# Patient Record
Sex: Female | Born: 1985 | Race: Black or African American | Hispanic: No | Marital: Single | Smoking: Current every day smoker
Health system: Southern US, Community
[De-identification: ages and names within clinical notes are randomized; demographics above are authoritative.]

## PROBLEM LIST (undated history)

## (undated) ENCOUNTER — Inpatient Hospital Stay: Payer: Self-pay

## (undated) DIAGNOSIS — F319 Bipolar disorder, unspecified: Secondary | ICD-10-CM

## (undated) DIAGNOSIS — B999 Unspecified infectious disease: Secondary | ICD-10-CM

## (undated) DIAGNOSIS — O99330 Smoking (tobacco) complicating pregnancy, unspecified trimester: Secondary | ICD-10-CM

## (undated) DIAGNOSIS — N611 Abscess of the breast and nipple: Secondary | ICD-10-CM

## (undated) DIAGNOSIS — R51 Headache: Secondary | ICD-10-CM

## (undated) DIAGNOSIS — E669 Obesity, unspecified: Secondary | ICD-10-CM

## (undated) DIAGNOSIS — F209 Schizophrenia, unspecified: Secondary | ICD-10-CM

## (undated) DIAGNOSIS — O2610 Low weight gain in pregnancy, unspecified trimester: Secondary | ICD-10-CM

## (undated) DIAGNOSIS — R519 Headache, unspecified: Secondary | ICD-10-CM

## (undated) DIAGNOSIS — F32A Depression, unspecified: Secondary | ICD-10-CM

## (undated) HISTORY — DX: Headache: R51

## (undated) HISTORY — DX: Obesity, unspecified: E66.9

## (undated) HISTORY — DX: Headache, unspecified: R51.9

## (undated) HISTORY — PX: DILATION AND CURETTAGE OF UTERUS: SHX78

## (undated) HISTORY — DX: Abscess of the breast and nipple: N61.1

## (undated) HISTORY — DX: Smoking (tobacco) complicating pregnancy, unspecified trimester: O99.330

## (undated) HISTORY — DX: Low weight gain in pregnancy, unspecified trimester: O26.10

---
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
## 2004-08-30 ENCOUNTER — Inpatient Hospital Stay (HOSPITAL_COMMUNITY): Admission: AD | Admit: 2004-08-30 | Discharge: 2004-09-01 | Payer: Self-pay | Admitting: *Deleted

## 2005-02-18 ENCOUNTER — Inpatient Hospital Stay (HOSPITAL_COMMUNITY): Admission: EM | Admit: 2005-02-18 | Discharge: 2005-02-19 | Payer: Self-pay | Admitting: Emergency Medicine

## 2006-01-05 ENCOUNTER — Emergency Department (HOSPITAL_COMMUNITY): Admission: EM | Admit: 2006-01-05 | Discharge: 2006-01-05 | Payer: Self-pay | Admitting: Emergency Medicine

## 2006-08-13 ENCOUNTER — Inpatient Hospital Stay (HOSPITAL_COMMUNITY): Admission: AD | Admit: 2006-08-13 | Discharge: 2006-08-13 | Payer: Self-pay | Admitting: Obstetrics

## 2006-08-14 ENCOUNTER — Inpatient Hospital Stay (HOSPITAL_COMMUNITY): Admission: AD | Admit: 2006-08-14 | Discharge: 2006-08-16 | Payer: Self-pay | Admitting: Obstetrics

## 2007-07-06 ENCOUNTER — Emergency Department (HOSPITAL_COMMUNITY): Admission: EM | Admit: 2007-07-06 | Discharge: 2007-07-06 | Payer: Self-pay | Admitting: Emergency Medicine

## 2007-08-01 ENCOUNTER — Inpatient Hospital Stay (HOSPITAL_COMMUNITY): Admission: AD | Admit: 2007-08-01 | Discharge: 2007-08-04 | Payer: Self-pay | Admitting: Gynecology

## 2007-08-01 ENCOUNTER — Ambulatory Visit: Payer: Self-pay | Admitting: Advanced Practice Midwife

## 2007-09-01 ENCOUNTER — Inpatient Hospital Stay (HOSPITAL_COMMUNITY): Admission: AD | Admit: 2007-09-01 | Discharge: 2007-09-01 | Payer: Self-pay | Admitting: Obstetrics & Gynecology

## 2009-04-24 ENCOUNTER — Emergency Department (HOSPITAL_COMMUNITY): Admission: EM | Admit: 2009-04-24 | Discharge: 2009-04-24 | Payer: Self-pay | Admitting: Emergency Medicine

## 2011-01-03 LAB — CBC
MCHC: 33.4 g/dL (ref 30.0–36.0)
Platelets: 288 10*3/uL (ref 150–400)
RDW: 13.4 % (ref 11.5–15.5)

## 2011-01-03 LAB — COMPREHENSIVE METABOLIC PANEL
AST: 25 U/L (ref 0–37)
Albumin: 4.3 g/dL (ref 3.5–5.2)
Chloride: 106 mEq/L (ref 96–112)
Creatinine, Ser: 0.77 mg/dL (ref 0.4–1.2)
GFR calc Af Amer: >60 mL/min (ref >60)
Potassium: 4.2 mEq/L (ref 3.5–5.1)
Total Bilirubin: 0.8 mg/dL (ref 0.3–1.2)
Total Protein: 7.1 g/dL (ref 6.0–8.3)

## 2011-01-03 LAB — URINALYSIS, ROUTINE W REFLEX MICROSCOPIC
Glucose, UA: NEGATIVE mg/dL
Specific Gravity, Urine: 1.022 (ref 1.005–1.030)
pH: 5.5 (ref 5.0–8.0)

## 2011-01-03 LAB — URINE MICROSCOPIC-ADD ON

## 2011-01-03 LAB — DIFFERENTIAL
Basophils Absolute: 0 10*3/uL (ref 0.0–0.1)
Basophils Relative: 0 % (ref 0–1)
Lymphocytes Relative: 12 % (ref 12–46)
Neutro Abs: 9.3 10*3/uL — ABNORMAL HIGH (ref 1.7–7.7)

## 2011-01-03 LAB — POCT PREGNANCY, URINE: Preg Test, Ur: NEGATIVE

## 2011-02-12 NOTE — H&P (Signed)
Kathryn Livingston               ACCOUNT NO.:  [id number]   MEDICAL RECORD NO.:  [id number]          PATIENT TYPE:  EMS   LOCATION:  ED                           FACILITY:  [facility]   PHYSICIAN:  Kela Millin, M.D.DATE OF BIRTH:  03-15-1986   DATE OF ADMISSION:  02/18/2005  DATE OF DISCHARGE:                                HISTORY & PHYSICAL   PRIMARY CARE PHYSICIAN:  Unassigned.   CHIEF COMPLAINT:  Drug overdose.   HISTORY OF PRESENT ILLNESS:  The patient is a 25 year old black female who  was brought in by EMS after taking a handful of Extra Strength Tylenol and  about 4 tablets of Aleve in an attempt to kill herself.  She denies  homicidal ideations.  She states that she took the tablets about 30 minutes  prior to presentation.  The patient was seen in the ER, and her initial  Tylenol level was 165.6.  She was given charcoal as well as Mucomyst, per ER  physician.  The patient denies dysuria, fevers, cough, chest pain,  hematochezia, melena, and no diarrhea.  She admits to nausea, but that only  started after she was given charcoal and later Mucomyst in the ER.  She is  admitted to the [facility] service for further evaluation and  management.   PAST MEDICAL HISTORY:  None.   MEDICATIONS:  None.   ALLERGIES:  NKDA.   SOCIAL HISTORY:  She states that she only drinks occasionally, although she  admits to drinking today prior to presentation.  She also smokes about a  pack of tobacco per day.  Admits to occasional marijuana use.   FAMILY HISTORY:  Her aunt has hypertension and diabetes.  Her grandmother  has cancer, type unknown.   REVIEW OF SYSTEMS:  As per HPI.  Other review of systems negative.   PHYSICAL EXAMINATION:  VITAL SIGNS:  Temperature 98.5, blood pressure  129/74, pulse 96, respiratory rate 20.  Her O2 sat is 99%.  GENERAL:  The patient is a young black female, alert and oriented x3 in no  apparent distress.  HEENT:  PERRL.  EOMI.  Muddy sclerae.   Mucous membranes stained secondary to  charcoal.  NECK:  Supple.  No adenopathy.  LUNGS:  Moderate air movement.  No wheezes.  CARDIOVASCULAR:  Regular rate and rhythm.  Normal S1 and S2.  No murmurs  appreciated.  ABDOMEN:  Soft.  Normal bowel sounds present.  Nontender.  Nondistended.  No  organomegaly or masses palpable.  EXTREMITIES:  No cyanosis or edema.  NEUROLOGIC:  Alert and oriented x 3.  Cranial nerves II-XII grossly intact.  Nonfocal exam.   LABORATORY DATA:  The initial Tylenol level was 165.6.  Her 4-hour Tylenol  level 89.6.  The urinalysis was cloudy in appearance.  Specific gravity of  1.008.  Her urine nitrites were positive.  Leukocyte esterase was moderate  with 3-6 WBCs and many bacteria.  Her white cell count is 8.4 with a  hemoglobin of 12.3, hematocrit 36.6.  Platelet count is 349.  Neutrophil  count is 63%.  Sodium 139, potassium  3.5 with a chloride of 104, CO2 23,  glucose 84, BUN 6, creatinine 0.8, calcium 9.5, total protein 8.  Her  albumin is 4.6.  AST 31.  ALT 21.  Alkaline phosphatase 87.  Total bilirubin  1.6.  Salicylate level is less than 4.  Alcohol level is 22.   ASSESSMENT/PLAN:  1.  Drug overdose/suicide attempt:  As already discussed, the patient took a      handful of Tylenol and some Aleve tablets.  She admits to suicidal      ideations.  Denies homicidal ideations.  She received charcoal in the ER      upon arrival and also was given Mucomyst per ER physician.  The 4-hour      Tylenol level was 89.6.  Per Poison Control, this 4-hour level is      nontoxic, so no further Mucomyst recommended.  The patient will be      admitted with one-on-one observation, suicide precautions.  I will      recheck her LFTs in the a.m., and if she remains stable, a psych      consultation for possible inpatient treatment.  2.  Urinary tract infection:  Will obtain urine cultures and empiric      antibiotics.  3.  Positive alcohol:  I will start the patient on  multivitamin, thiamine,      and Ativan.      ACV/MEDQ  D:  02/18/2005  T:  02/18/2005  Job:  [id number]

## 2011-07-05 LAB — WOUND CULTURE: Gram Stain: NONE SEEN

## 2011-07-06 LAB — CBC
HCT: 29.6 — ABNORMAL LOW
MCHC: 34
MCV: 93.2
Platelets: 296
Platelets: 318
RBC: 3.35 — ABNORMAL LOW
RDW: 14
RDW: 14.1 — ABNORMAL HIGH
WBC: 10.4

## 2011-07-06 LAB — TYPE AND SCREEN
ABO/RH(D): B POS
Antibody Screen: NEGATIVE

## 2011-07-06 LAB — DIFFERENTIAL
Basophils Absolute: 0
Lymphocytes Relative: 18
Lymphs Abs: 1.9
Neutro Abs: 8.2 — ABNORMAL HIGH
Neutrophils Relative %: 78 — ABNORMAL HIGH

## 2011-07-06 LAB — RPR: RPR Ser Ql: NONREACTIVE

## 2011-07-06 LAB — RAPID HIV SCREEN (WH-MAU): Rapid HIV Screen: NONREACTIVE

## 2011-07-06 LAB — HEPATITIS B SURFACE ANTIGEN: Hepatitis B Surface Ag: NEGATIVE

## 2011-07-06 LAB — RUBELLA SCREEN: Rubella: >500 — ABNORMAL HIGH

## 2011-07-08 LAB — I-STAT 8, (EC8 V) (CONVERTED LAB)
BUN: <3 — ABNORMAL LOW
Chloride: 107
Glucose, Bld: 75
Operator id: 198171
Sodium: 137
pCO2, Ven: 38.2 — ABNORMAL LOW
pH, Ven: 7.362 — ABNORMAL HIGH

## 2011-07-08 LAB — POCT I-STAT CREATININE
Creatinine, Ser: 0.5
Operator id: 198171

## 2011-07-08 LAB — CBC
MCHC: 32.9
MCV: 94.1
Platelets: 283
RBC: 3.05 — ABNORMAL LOW
WBC: 8.3

## 2011-07-08 LAB — DIFFERENTIAL
Basophils Relative: 0
Eosinophils Absolute: 0.1
Lymphs Abs: 2.6
Neutro Abs: 5.2
Neutrophils Relative %: 63

## 2011-07-08 LAB — POCT PREGNANCY, URINE: Preg Test, Ur: POSITIVE

## 2011-07-08 LAB — URINALYSIS, ROUTINE W REFLEX MICROSCOPIC
Glucose, UA: NEGATIVE
Hgb urine dipstick: NEGATIVE
Ketones, ur: NEGATIVE
Protein, ur: NEGATIVE
pH: 7

## 2011-07-08 LAB — URINE MICROSCOPIC-ADD ON

## 2011-10-10 ENCOUNTER — Emergency Department (HOSPITAL_COMMUNITY)
Admission: EM | Admit: 2011-10-10 | Discharge: 2011-10-10 | Disposition: A | Discharge status: Home / Self Care | Payer: Medicaid Other | Attending: Emergency Medicine | Admitting: Emergency Medicine

## 2011-10-10 ENCOUNTER — Encounter (HOSPITAL_COMMUNITY): Payer: Self-pay | Admitting: *Deleted

## 2011-10-10 DIAGNOSIS — N390 Urinary tract infection, site not specified: Secondary | ICD-10-CM

## 2011-10-10 DIAGNOSIS — R319 Hematuria, unspecified: Secondary | ICD-10-CM | POA: Insufficient documentation

## 2011-10-10 LAB — URINALYSIS, ROUTINE W REFLEX MICROSCOPIC
Nitrite: NEGATIVE
Specific Gravity, Urine: 1.004 — ABNORMAL LOW (ref 1.005–1.030)
Urobilinogen, UA: 0.2 mg/dL (ref 0.0–1.0)

## 2011-10-10 LAB — POCT PREGNANCY, URINE: Preg Test, Ur: NEGATIVE

## 2011-10-10 LAB — URINE MICROSCOPIC-ADD ON

## 2011-10-10 MED ORDER — CIPROFLOXACIN HCL 500 MG PO TABS: 500.0000 mg | ORAL_TABLET | Freq: Two times a day (BID) | ORAL | Status: AC

## 2011-10-10 MED ORDER — CIPROFLOXACIN HCL 500 MG PO TABS
500.0000 mg | ORAL_TABLET | Freq: Once | ORAL | Status: AC
  Administered 2011-10-10: 500 mg via ORAL
  Filled 2011-10-10: qty 1

## 2011-10-10 NOTE — ED Notes (Signed)
Pt reports blood in urine today.  Denies pain with urination, no flank pain.  Also mentioned a knot behind (R) ear that has been there for several months-no knot noted. Pt also reporting HA.

## 2011-10-10 NOTE — ED Provider Notes (Signed)
History     CSN: [id number]  Arrival date & time 10/10/11  0010   First MD Initiated Contact with Patient 10/10/11 0022      Chief Complaint  Patient presents with  . Hematuria    (Consider location/radiation/quality/duration/timing/severity/associated sxs/prior treatment) HPI Comments: Pt has had light hematuria all day, has no other sx, denies fevers.  Sx are mild, persistent, and not associated with n/v/dysuria.  Has never had before. Tried to place tissue in her vagina to see if it was vaginal but no blood came out.  She has no missed LMP  Patient is a 26 y.o. female presenting with hematuria. The history is provided by the patient.  Hematuria This is a new problem. The current episode started today. The problem is unchanged. She describes the hematuria as gross hematuria. She reports no clotting in her urine stream. Her pain is at a severity of 0/10. She is experiencing no pain. She describes her urine color as light pink. Irritative symptoms do not include frequency, nocturia or urgency. Obstructive symptoms do not include dribbling, incomplete emptying, an intermittent stream, a slower stream or a weak stream. Pertinent negatives include no abdominal pain, chills, dysuria, fever, flank pain, genital pain, nausea or vomiting. She is sexually active.    History reviewed. No pertinent past medical history.  History reviewed. No pertinent past surgical history.  History reviewed. No pertinent family history.  History  Substance Use Topics  . Smoking status: Current Everyday Smoker -- 1.0 packs/day for 10 years    Types: Cigarettes  . Smokeless tobacco: Not on file  . Alcohol Use: Yes     2-3 40s a day for several years    OB History    Grav Para Term Preterm Abortions TAB SAB Ect Mult Living                  Review of Systems  Constitutional: Negative for fever and chills.  Gastrointestinal: Negative for nausea, vomiting and abdominal pain.  Genitourinary: Positive  for hematuria. Negative for dysuria, urgency, frequency, flank pain, incomplete emptying and nocturia.  All other systems reviewed and are negative.    Allergies  Review of patient's allergies indicates no known allergies.  Home Medications   Current Outpatient Rx  Name Route Sig Dispense Refill  . IBUPROFEN 200 MG PO TABS Oral Take 400 mg by mouth once as needed. For pain.    Marland Kitchen CIPROFLOXACIN HCL 500 MG PO TABS Oral Take 1 tablet (500 mg total) by mouth every 12 (twelve) hours. 10 tablet 0    BP 123/68  Pulse 98  Temp(Src) 98 F (36.7 C) (Oral)  Resp 18  SpO2 97%  LMP 09/09/2011  Physical Exam  Nursing note and vitals reviewed. Constitutional: She appears well-developed and well-nourished. No distress.  HENT:  Head: Normocephalic and atraumatic.  Mouth/Throat: Oropharynx is clear and moist. No oropharyngeal exudate.  Eyes: Conjunctivae and EOM are normal. Pupils are equal, round, and reactive to light. Right eye exhibits no discharge. Left eye exhibits no discharge. No scleral icterus.  Neck: Normal range of motion. Neck supple. No JVD present. No thyromegaly present.  Cardiovascular: Normal rate, regular rhythm, normal heart sounds and intact distal pulses.  Exam reveals no gallop and no friction rub.   No murmur heard. Pulmonary/Chest: Effort normal and breath sounds normal. No respiratory distress. She has no wheezes. She has no rales.  Abdominal: Soft. Bowel sounds are normal. She exhibits no distension and no mass. There is no  tenderness.       No CVA ttp  Musculoskeletal: Normal range of motion. She exhibits no edema and no tenderness.  Lymphadenopathy:    She has no cervical adenopathy.  Neurological: She is alert. Coordination normal.  Skin: Skin is warm and dry. No rash noted. No erythema.  Psychiatric: She has a normal mood and affect. Her behavior is normal.    ED Course  Procedures (including critical care time)  Labs Reviewed  URINALYSIS, ROUTINE W REFLEX  MICROSCOPIC - Abnormal; Notable for the following:    APPearance CLOUDY (*)    Specific Gravity, Urine 1.004 (*)    Hgb urine dipstick LARGE (*)    Leukocytes, UA MODERATE (*)    All other components within normal limits  URINE MICROSCOPIC-ADD ON - Abnormal; Notable for the following:    Squamous Epithelial / LPF FEW (*)    Bacteria, UA MANY (*)    All other components within normal limits  POCT PREGNANCY, URINE  POCT PREGNANCY, URINE   No results found.   1. UTI (lower urinary tract infection)       MDM  Benign exam, preg, ua, reeval.  VS normal.   Laboratory evaluation shows nonpregnant, urinary tract infection with mild microscopic hematuria and white blood cells with many bacteria. Ciprofloxacin given in emergency Department by mouth, will discharge with this and  Discharge Prescriptions include:  #1 ciprofloxacin      Vida Roller, MD 10/10/11 [phone]

## 2012-03-14 ENCOUNTER — Encounter (HOSPITAL_COMMUNITY): Payer: Self-pay | Admitting: Emergency Medicine

## 2012-03-14 ENCOUNTER — Emergency Department (HOSPITAL_COMMUNITY)
Admission: EM | Admit: 2012-03-14 | Discharge: 2012-03-14 | Disposition: A | Discharge status: Home / Self Care | Payer: Medicaid Other | Attending: Emergency Medicine | Admitting: Emergency Medicine

## 2012-03-14 DIAGNOSIS — L02415 Cutaneous abscess of right lower limb: Secondary | ICD-10-CM

## 2012-03-14 DIAGNOSIS — L03119 Cellulitis of unspecified part of limb: Secondary | ICD-10-CM | POA: Insufficient documentation

## 2012-03-14 DIAGNOSIS — F17200 Nicotine dependence, unspecified, uncomplicated: Secondary | ICD-10-CM | POA: Insufficient documentation

## 2012-03-14 DIAGNOSIS — L02419 Cutaneous abscess of limb, unspecified: Secondary | ICD-10-CM | POA: Insufficient documentation

## 2012-03-14 MED ORDER — SULFAMETHOXAZOLE-TMP DS 800-160 MG PO TABS
1.0000 | ORAL_TABLET | Freq: Once | ORAL | Status: AC
  Administered 2012-03-14: 1 via ORAL
  Filled 2012-03-14: qty 1

## 2012-03-14 MED ORDER — HYDROCODONE-ACETAMINOPHEN 5-325 MG PO TABS: 1.0000 | ORAL_TABLET | ORAL | Status: AC | PRN

## 2012-03-14 MED ORDER — HYDROCODONE-ACETAMINOPHEN 5-325 MG PO TABS
1.0000 | ORAL_TABLET | Freq: Once | ORAL | Status: AC
  Administered 2012-03-14: 1 via ORAL
  Filled 2012-03-14: qty 1

## 2012-03-14 MED ORDER — SULFAMETHOXAZOLE-TMP DS 800-160 MG PO TABS: 1.0000 | ORAL_TABLET | Freq: Two times a day (BID) | ORAL | Status: AC

## 2012-03-14 NOTE — Discharge Instructions (Signed)

## 2012-03-14 NOTE — ED Provider Notes (Signed)
Medical screening examination/treatment/procedure(s) were performed by non-physician practitioner and as supervising physician I was immediately available for consultation/collaboration.   Kaegan Stigler, MD 03/14/12 2309 

## 2012-03-14 NOTE — ED Notes (Addendum)
Patient with boil in inner right thigh.  Painful to walk and sitting.  Patient also complaining of left thumb numbness and right elbow pain.  No injuries to either.

## 2012-03-14 NOTE — ED Provider Notes (Signed)
History     CSN: [id number]  Arrival date & time 03/14/12  7829   First MD Initiated Contact with Patient 03/14/12 2040      Chief Complaint  Patient presents with  . Recurrent Skin Infections    (Consider location/radiation/quality/duration/timing/severity/associated sxs/prior treatment) HPI Comments: Patient here with right upper thigh abscess - no drainage - denies fever, chills, surrounding tissue redness, nausea or vomiting.  Patient is a 26 y.o. female presenting with abscess. The history is provided by the patient. No language interpreter was used.  Abscess  This is a new problem. The current episode started yesterday. The onset was gradual. The problem occurs rarely. The problem has been unchanged. The abscess is present on the right upper leg. The problem is moderate. The abscess is characterized by redness, painfulness and swelling. The patient was exposed to OTC medications. The abscess first occurred at home. Pertinent negatives include no anorexia, no decrease in physical activity, not sleeping less, not drinking less, no fever, no fussiness, not sleeping more, no diarrhea, no vomiting, no congestion, no rhinorrhea, no sore throat, no decreased responsiveness and no cough. Her past medical history does not include skin abscesses in family. There were no sick contacts. She has received no recent medical care.    History reviewed. No pertinent past medical history.  History reviewed. No pertinent past surgical history.  History reviewed. No pertinent family history.  History  Substance Use Topics  . Smoking status: Current Everyday Smoker -- 1.0 packs/day for 10 years    Types: Cigarettes  . Smokeless tobacco: Not on file  . Alcohol Use: Yes     2-3 40s a day for several years    OB History    Grav Para Term Preterm Abortions TAB SAB Ect Mult Living                  Review of Systems  Constitutional: Negative for fever and decreased responsiveness.  HENT:  Negative for congestion, sore throat and rhinorrhea.   Respiratory: Negative for cough.   Gastrointestinal: Negative for vomiting, diarrhea and anorexia.  Genitourinary: Negative for dysuria and genital sores.  Musculoskeletal: Negative for back pain.  Skin: Positive for wound.  Neurological: Negative for headaches.  All other systems reviewed and are negative.    Allergies  Review of patient's allergies indicates no known allergies.  Home Medications  No current outpatient prescriptions on file.  BP 111/73  Pulse 75  Temp 97.5 F (36.4 C) (Oral)  Resp 18  SpO2 97%  LMP 03/08/2012  Physical Exam  Nursing note and vitals reviewed. Constitutional: She is oriented to person, place, and time. She appears well-developed and well-nourished. No distress.  HENT:  Head: Normocephalic and atraumatic.  Right Ear: External ear normal.  Left Ear: External ear normal.  Nose: Nose normal.  Mouth/Throat: Oropharynx is clear and moist. No oropharyngeal exudate.  Eyes: Conjunctivae are normal. Pupils are equal, round, and reactive to light. No scleral icterus.  Neck: Normal range of motion. Neck supple.  Cardiovascular: Normal rate, regular rhythm and normal heart sounds.  Exam reveals no gallop and no friction rub.   No murmur heard. Pulmonary/Chest: Effort normal and breath sounds normal. No respiratory distress. She has no wheezes. She has no rales. She exhibits no tenderness.  Abdominal: Soft. Bowel sounds are normal. She exhibits no distension. There is no tenderness.  Musculoskeletal: Normal range of motion. She exhibits no edema and no tenderness.  Lymphadenopathy:    She has no  cervical adenopathy.  Neurological: She is alert and oriented to person, place, and time. No cranial nerve deficit.  Skin: Skin is warm and dry. No rash noted. There is erythema. No pallor.       1 cm area of induration to right medial upper thigh  Psychiatric: She has a normal mood and affect. Her behavior  is normal. Judgment and thought content normal.    ED Course  Procedures (including critical care time)  Labs Reviewed - No data to display No results found. INCISION AND DRAINAGE Performed by: Cherrie Distance C. Consent: Verbal consent obtained. Risks and benefits: risks, benefits and alternatives were discussed Type: abscess  Body area: right thigh  Anesthesia: local infiltration  Local anesthetic: lidocaine 2% with epinephrine  Anesthetic total: 4 ml  Complexity: complex Blunt dissection to break up loculations  Drainage: purulent  Drainage amount: small  Packing material: 1/4 in iodoform gauze  Patient tolerance: Patient tolerated the procedure well with no immediate complications.    Thigh abscess    MDM  Patient with abscess of right upper thigh - small amount of drainage noted.        Izola Price [place], [place] 03/14/12 2252

## 2012-10-28 ENCOUNTER — Emergency Department (HOSPITAL_COMMUNITY)
Admission: EM | Admit: 2012-10-28 | Discharge: 2012-10-28 | Disposition: A | Discharge status: Home / Self Care | Payer: Medicaid Other | Attending: Emergency Medicine | Admitting: Emergency Medicine

## 2012-10-28 ENCOUNTER — Encounter (HOSPITAL_COMMUNITY): Payer: Self-pay

## 2012-10-28 DIAGNOSIS — S0990XA Unspecified injury of head, initial encounter: Secondary | ICD-10-CM | POA: Insufficient documentation

## 2012-10-28 DIAGNOSIS — F17200 Nicotine dependence, unspecified, uncomplicated: Secondary | ICD-10-CM | POA: Insufficient documentation

## 2012-10-28 DIAGNOSIS — Z0000 Encounter for general adult medical examination without abnormal findings: Secondary | ICD-10-CM
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
## 2012-10-28 NOTE — ED Notes (Signed)
Post discharge note- received signed release from [facility] for discharge summary from ED visit- faxed to [place], [place] at [phone].

## 2014-09-27 NOTE — L&D Delivery Note (Signed)
Delivery Summary for [facility]  Labor Events:   Preterm labor:   Rupture date:   Rupture time:   Rupture type: Spontaneous  Fluid Color: Clear  Induction:   Augmentation:   Complications:   Cervical ripening:          Delivery:   Episiotomy:   Lacerations:   Repair suture:   Repair # of packets:   Blood loss (ml): 300   Information for the patient's newborn:  Valley, Ke [[id number]]    Delivery 04/19/2015 4:32 PM by  Vaginal, Spontaneous Delivery Sex:  female Gestational Age: [redacted]w[redacted]d Delivery Clinician:  Hildred Laser Living?: Yes        APGARS  One minute Five minutes Ten minutes  Skin color: 1   1      Heart rate: 2   2      Grimace: 2   2      Muscle tone: 2   2      Breathing: 2   2      Totals: 9  9      Presentation/position: Vertex  Right Occiput Anterior Resuscitation: None  Cord information: 3 vessels   Disposition of cord blood: No    Blood gases sent? No Complications:   Placenta: Delivered: 04/19/2015 4:47 PM  Manual removal   appearance Newborn Measurements: Weight: 6 lb 8.2 oz (2954 g)  Height: 19.09"  Head circumference: 31.5 cm  Chest circumference: 31.5 cm  Other providers:  Tammy F Hall Amy F Daniels  Additional  information: Forceps:   Vacuum:   Breech:   Observed anomalies       Delivery Summary for Randall Hiss  Labor Events:   Preterm labor:   Rupture date:   Rupture time:   Rupture type: Spontaneous  Fluid Color: Clear  Induction:   Augmentation:   Complications:   Cervical ripening:          Delivery:   Episiotomy:   Lacerations:   Repair suture:   Repair # of packets:   Blood loss (ml): 300   Information for the patient's newborn:  Josalynn, Johndrow [[id number]]    Delivery 04/19/2015 4:32 PM by  Vaginal, Spontaneous Delivery Sex:  female Gestational Age: [redacted]w[redacted]d Delivery Clinician:  Hildred Laser Living?: Yes        APGARS  One minute Five minutes Ten minutes  Skin color: 1   1      Heart rate: 2    2      Grimace: 2   2      Muscle tone: 2   2      Breathing: 2   2      Totals: 9  9      Presentation/position: Vertex  Right Occiput Anterior Resuscitation: None  Cord information: 3 vessels   Disposition of cord blood: No    Blood gases sent? No Complications:   Placenta: Delivered: 04/19/2015 4:47 PM  Manual removal   appearance Newborn Measurements: Weight: 6 lb 8.2 oz (2954 g)  Height: 19.09"  Head circumference: 31.5 cm  Chest circumference: 31.5 cm  Other providers:  Tammy F Hall Amy F Daniels  Additional  information: Forceps:   Vacuum:   Breech:   Observed anomalies        Delivery Note At 4:32 PM a viable female was delivered via Vaginal, Spontaneous Delivery (Presentation: Right Occiput Anterior).  APGAR: 9, 9; weight 6 lb 8.2 oz (2954 g).   Placenta status: intact ,  Manual removal due to cord avulsion.  Cord: 3 vessels.  Cord pH: not performed  Anesthesia: Epidural  Episiotomy: None Lacerations:   Suture Repair: none Est. Blood Loss (mL): 300  Mom to postpartum.  Baby to Couplet care / Skin to Skin.  Hildred Laser 04/19/2015, 5:12 PM

## 2014-11-08 ENCOUNTER — Emergency Department: Payer: Self-pay | Admitting: Emergency Medicine

## 2014-11-15 LAB — OB RESULTS CONSOLE ABO/RH: RH TYPE: POSITIVE

## 2014-11-15 LAB — OB RESULTS CONSOLE ANTIBODY SCREEN: Antibody Screen: NEGATIVE

## 2014-11-15 LAB — OB RESULTS CONSOLE GC/CHLAMYDIA
CHLAMYDIA, DNA PROBE: NEGATIVE
Gonorrhea: NEGATIVE

## 2014-11-15 LAB — SICKLE CELL SCREEN: SICKLE CELL SCREEN: NEGATIVE

## 2014-11-15 LAB — OB RESULTS CONSOLE VARICELLA ZOSTER ANTIBODY, IGG: VARICELLA IGG: IMMUNE

## 2014-11-15 LAB — OB RESULTS CONSOLE RPR: RPR: NONREACTIVE

## 2014-11-15 LAB — OB RESULTS CONSOLE RUBELLA ANTIBODY, IGM: Rubella: IMMUNE

## 2014-11-15 LAB — OB RESULTS CONSOLE HEPATITIS B SURFACE ANTIGEN: Hepatitis B Surface Ag: NEGATIVE

## 2014-11-15 LAB — OB RESULTS CONSOLE HIV ANTIBODY (ROUTINE TESTING): HIV: NONREACTIVE

## 2014-11-18 ENCOUNTER — Emergency Department: Payer: Self-pay | Admitting: Emergency Medicine

## 2014-12-19 ENCOUNTER — Encounter: Payer: Self-pay | Admitting: Maternal & Fetal Medicine

## 2015-01-20 ENCOUNTER — Encounter
Admit: 2015-01-20 | Disposition: A | Discharge status: Home / Self Care | Payer: Self-pay | Attending: Obstetrics & Gynecology | Admitting: Obstetrics & Gynecology

## 2015-02-26 ENCOUNTER — Ambulatory Visit (INDEPENDENT_AMBULATORY_CARE_PROVIDER_SITE_OTHER): Payer: Medicaid Other | Admitting: Obstetrics and Gynecology

## 2015-02-26 ENCOUNTER — Encounter: Payer: Self-pay | Admitting: Obstetrics and Gynecology

## 2015-02-26 VITALS — BP 120/72 | HR 82 | Ht 66.0 in | Wt 221.5 lb

## 2015-02-26 DIAGNOSIS — O2612 Low weight gain in pregnancy, second trimester: Secondary | ICD-10-CM

## 2015-02-26 DIAGNOSIS — O99019 Anemia complicating pregnancy, unspecified trimester: Secondary | ICD-10-CM

## 2015-02-26 DIAGNOSIS — O26899 Other specified pregnancy related conditions, unspecified trimester: Secondary | ICD-10-CM | POA: Insufficient documentation

## 2015-02-26 DIAGNOSIS — R51 Headache: Secondary | ICD-10-CM

## 2015-02-26 DIAGNOSIS — Z720 Tobacco use: Secondary | ICD-10-CM

## 2015-02-26 DIAGNOSIS — O26893 Other specified pregnancy related conditions, third trimester: Secondary | ICD-10-CM

## 2015-02-26 DIAGNOSIS — O280 Abnormal hematological finding on antenatal screening of mother: Secondary | ICD-10-CM | POA: Insufficient documentation

## 2015-02-26 DIAGNOSIS — Z3493 Encounter for supervision of normal pregnancy, unspecified, third trimester: Secondary | ICD-10-CM

## 2015-02-26 DIAGNOSIS — F1290 Cannabis use, unspecified, uncomplicated: Secondary | ICD-10-CM | POA: Insufficient documentation

## 2015-02-26 DIAGNOSIS — R519 Headache, unspecified: Secondary | ICD-10-CM | POA: Insufficient documentation

## 2015-02-26 DIAGNOSIS — O2610 Low weight gain in pregnancy, unspecified trimester: Secondary | ICD-10-CM | POA: Insufficient documentation

## 2015-02-26 DIAGNOSIS — O99012 Anemia complicating pregnancy, second trimester: Secondary | ICD-10-CM

## 2015-02-26 DIAGNOSIS — F1210 Cannabis abuse, uncomplicated: Secondary | ICD-10-CM

## 2015-02-26 HISTORY — DX: Anemia complicating pregnancy, unspecified trimester: O99.019

## 2015-02-26 HISTORY — DX: Abnormal hematological finding on antenatal screening of mother: O28.0

## 2015-02-26 LAB — POCT URINALYSIS DIPSTICK
Bilirubin, UA: NEGATIVE
GLUCOSE UA: NEGATIVE
KETONES UA: NEGATIVE
Nitrite, UA: NEGATIVE
PH UA: 7.5
Protein, UA: NEGATIVE
Spec Grav, UA: <=1.005
UROBILINOGEN UA: 0.2

## 2015-02-26 NOTE — Progress Notes (Signed)
ROB: Patient doing well, no complaints. Still admits to smoking cigarettes (however has increased to 5-6/day as she is trying to cut down on marijuana use (currently 1 blunt per day)).  Tried Ensure and Boost to increase weight gain but did not like the taste. Was using marijuana to increase appetite. Counseled again on risks of tobacco and marijuana use in pregnancy. Recommend initiation of nicotine gum. Can try protein bars for increasing weight. RTC in 2 weeks.

## 2015-02-26 NOTE — Patient Instructions (Addendum)
Advised on cessation of smoking (tobacco and marijuana). Can begin use of nicotine gum if needed.  RTC in 2 weeks for routine OB visit.  Fetal kick counts twice daily.  Preterm labor precautions.

## 2015-03-03 ENCOUNTER — Ambulatory Visit
Admission: RE | Admit: 2015-03-03 | Discharge: 2015-03-03 | Disposition: A | Discharge status: Home / Self Care | Payer: Medicaid Other | Source: Ambulatory Visit | Attending: Obstetrics & Gynecology | Admitting: Obstetrics & Gynecology

## 2015-03-03 ENCOUNTER — Other Ambulatory Visit: Payer: Self-pay | Admitting: Obstetrics & Gynecology

## 2015-03-03 VITALS — BP 108/63 | HR 95 | Temp 98.2°F | Wt 224.0 lb

## 2015-03-03 DIAGNOSIS — Z3A33 33 weeks gestation of pregnancy: Secondary | ICD-10-CM | POA: Insufficient documentation

## 2015-03-03 DIAGNOSIS — O403XX0 Polyhydramnios, third trimester, not applicable or unspecified: Secondary | ICD-10-CM

## 2015-03-03 DIAGNOSIS — O99012 Anemia complicating pregnancy, second trimester: Secondary | ICD-10-CM

## 2015-03-03 DIAGNOSIS — O280 Abnormal hematological finding on antenatal screening of mother: Secondary | ICD-10-CM

## 2015-03-03 DIAGNOSIS — O403XX1 Polyhydramnios, third trimester, fetus 1: Secondary | ICD-10-CM

## 2015-03-09 ENCOUNTER — Observation Stay
Admission: EM | Admit: 2015-03-09 | Discharge: 2015-03-10 | Disposition: A | Discharge status: Home / Self Care | Payer: Medicaid Other | Attending: Obstetrics and Gynecology | Admitting: Obstetrics and Gynecology

## 2015-03-09 DIAGNOSIS — R109 Unspecified abdominal pain: Secondary | ICD-10-CM | POA: Insufficient documentation

## 2015-03-09 DIAGNOSIS — O26893 Other specified pregnancy related conditions, third trimester: Principal | ICD-10-CM | POA: Insufficient documentation

## 2015-03-09 DIAGNOSIS — Z3A34 34 weeks gestation of pregnancy: Secondary | ICD-10-CM | POA: Insufficient documentation

## 2015-03-10 ENCOUNTER — Encounter: Payer: Self-pay | Admitting: Advanced Practice Midwife

## 2015-03-10 DIAGNOSIS — Z3A34 34 weeks gestation of pregnancy: Secondary | ICD-10-CM | POA: Diagnosis not present

## 2015-03-10 DIAGNOSIS — R109 Unspecified abdominal pain: Secondary | ICD-10-CM | POA: Diagnosis present

## 2015-03-10 DIAGNOSIS — O26893 Other specified pregnancy related conditions, third trimester: Secondary | ICD-10-CM | POA: Diagnosis not present

## 2015-03-10 LAB — URINALYSIS COMPLETE WITH MICROSCOPIC (ARMC ONLY)
Bilirubin Urine: NEGATIVE
GLUCOSE, UA: NEGATIVE mg/dL
Hgb urine dipstick: NEGATIVE
Ketones, ur: NEGATIVE mg/dL
NITRITE: NEGATIVE
Protein, ur: NEGATIVE mg/dL
SPECIFIC GRAVITY, URINE: 1.003 — AB (ref 1.005–1.030)
pH: 6 (ref 5.0–8.0)

## 2015-03-10 MED ORDER — ACETAMINOPHEN 325 MG PO TABS: 650.0000 mg | ORAL_TABLET | ORAL | Status: DC | PRN

## 2015-03-10 NOTE — OB Triage Note (Signed)
Pt arrived via EMS. Gown changed, placed in LDR 2, fetal monitoring initiated

## 2015-03-10 NOTE — Discharge Summary (Signed)
Pt discharged home in stable condition accompanied by FOB and two daughters.  Discharge instructions reviewed, pt and FOB verbalized understanding.  Prenatal appointment this wednesday

## 2015-03-12 ENCOUNTER — Encounter: Payer: Medicaid Other | Admitting: Obstetrics and Gynecology

## 2015-03-13 ENCOUNTER — Ambulatory Visit: Payer: Self-pay

## 2015-03-13 ENCOUNTER — Ambulatory Visit (INDEPENDENT_AMBULATORY_CARE_PROVIDER_SITE_OTHER): Payer: Medicaid Other | Admitting: Obstetrics and Gynecology

## 2015-03-13 VITALS — BP 112/72 | HR 89 | Wt 226.2 lb

## 2015-03-13 DIAGNOSIS — Z3403 Encounter for supervision of normal first pregnancy, third trimester: Secondary | ICD-10-CM

## 2015-03-13 LAB — POCT URINALYSIS DIPSTICK
Bilirubin, UA: NEGATIVE
GLUCOSE UA: NEGATIVE
Ketones, UA: NEGATIVE
Nitrite, UA: NEGATIVE
Protein, UA: NEGATIVE
SPEC GRAV UA: 1.01
Urobilinogen, UA: 0.2
pH, UA: 6.5

## 2015-03-13 MED ORDER — ACETAMINOPHEN-CODEINE #3 300-30 MG PO TABS: 1.0000 | ORAL_TABLET | Freq: Four times a day (QID) | ORAL | Status: DC | PRN

## 2015-03-13 NOTE — Patient Instructions (Signed)
Preterm labor precautions reviewed.  Call office or follow up at Labor and Delivery for the following: 1) Contractions less than 10 min apart for longer than 1 hour 2) Rupture of membranes 3) Vaginal bleeding requiring a pad 4) Decreased fetal movement  

## 2015-03-13 NOTE — Progress Notes (Signed)
Patient complains of feet and hand swelling.  Also reports that she needs refill on pain medication for headaches. BPs wnl.  No symptoms of pre-eclampsia.  Trace pedal edema noted on exam.  Discussed compression hose, elevation of legs when at rest.  Will refill T#3 for headaches.  RTC in 2 weeks.  PTL precautions given. For 36 week labs at that time.

## 2015-03-17 ENCOUNTER — Other Ambulatory Visit: Payer: Medicaid Other

## 2015-03-17 ENCOUNTER — Ambulatory Visit
Admission: RE | Admit: 2015-03-17 | Discharge: 2015-03-17 | Disposition: A | Discharge status: Home / Self Care | Payer: Medicaid Other | Source: Ambulatory Visit | Attending: Obstetrics and Gynecology | Admitting: Obstetrics and Gynecology

## 2015-03-17 VITALS — BP 126/73 | HR 91 | Temp 98.2°F | Resp 18 | Ht 65.0 in | Wt 234.0 lb

## 2015-03-17 DIAGNOSIS — Z3A33 33 weeks gestation of pregnancy: Secondary | ICD-10-CM

## 2015-03-17 DIAGNOSIS — O280 Abnormal hematological finding on antenatal screening of mother: Secondary | ICD-10-CM

## 2015-03-17 DIAGNOSIS — Z3A35 35 weeks gestation of pregnancy: Secondary | ICD-10-CM | POA: Diagnosis not present

## 2015-03-17 DIAGNOSIS — O403XX0 Polyhydramnios, third trimester, not applicable or unspecified: Secondary | ICD-10-CM

## 2015-03-17 DIAGNOSIS — O403XX1 Polyhydramnios, third trimester, fetus 1: Secondary | ICD-10-CM

## 2015-03-17 DIAGNOSIS — O99013 Anemia complicating pregnancy, third trimester: Secondary | ICD-10-CM

## 2015-03-17 DIAGNOSIS — Z8759 Personal history of other complications of pregnancy, childbirth and the puerperium: Secondary | ICD-10-CM | POA: Insufficient documentation

## 2015-03-20 ENCOUNTER — Encounter: Payer: Self-pay | Admitting: *Deleted

## 2015-03-20 ENCOUNTER — Telehealth: Payer: Self-pay | Admitting: Obstetrics and Gynecology

## 2015-03-20 ENCOUNTER — Observation Stay
Admission: EM | Admit: 2015-03-20 | Discharge: 2015-03-20 | Disposition: A | Discharge status: Home / Self Care | Payer: Medicaid Other | Attending: Obstetrics and Gynecology | Admitting: Obstetrics and Gynecology

## 2015-03-20 DIAGNOSIS — R600 Localized edema: Secondary | ICD-10-CM | POA: Insufficient documentation

## 2015-03-20 DIAGNOSIS — O26899 Other specified pregnancy related conditions, unspecified trimester: Secondary | ICD-10-CM | POA: Diagnosis not present

## 2015-03-20 MED ORDER — FUROSEMIDE 10 MG/ML IJ SOLN
10.0000 mg | Freq: Once | INTRAMUSCULAR | Status: AC
  Administered 2015-03-20: 10 mg via INTRAMUSCULAR
  Filled 2015-03-20: qty 1

## 2015-03-20 NOTE — Telephone Encounter (Signed)
Pt called and she is about [redacted] weeks pregnant and her feet are swollen and both side from waist down are hurting, pain in shoulder doesn't know if its a pinched nerve or not, she was taking the tylenol 3 that dr cherry gave her and its not helping, pt would like a call back to know if there is anything she can do.
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
## 2015-03-20 NOTE — Telephone Encounter (Signed)
Called pt she states that she has tried compression stockings as well as elevating legs and nothing is helping. Per Dr.Cherry pt was advised to go to the ER and be evaluated in L&D. Pt gave verbal understanding.

## 2015-03-20 NOTE — Discharge Instructions (Signed)
AVS discharge instructions and labor/pregnancy precautions given to patient with no questions or concerns.   Scot Shiraishi F  

## 2015-03-24 ENCOUNTER — Ambulatory Visit
Admission: RE | Admit: 2015-03-24 | Discharge: 2015-03-24 | Disposition: A | Discharge status: Home / Self Care | Payer: Medicaid Other | Source: Ambulatory Visit | Attending: Maternal & Fetal Medicine | Admitting: Maternal & Fetal Medicine

## 2015-03-24 DIAGNOSIS — O409XX0 Polyhydramnios, unspecified trimester, not applicable or unspecified: Secondary | ICD-10-CM

## 2015-03-24 DIAGNOSIS — O403XX1 Polyhydramnios, third trimester, fetus 1: Secondary | ICD-10-CM

## 2015-03-24 DIAGNOSIS — O403XX0 Polyhydramnios, third trimester, not applicable or unspecified: Secondary | ICD-10-CM | POA: Insufficient documentation

## 2015-03-24 DIAGNOSIS — Z3A36 36 weeks gestation of pregnancy: Secondary | ICD-10-CM | POA: Diagnosis not present

## 2015-03-24 NOTE — Progress Notes (Signed)
Ultrasound note (printer not functioning) Kathryn Livingston returns for follow up antenatal testing in the setting of an elevated risk for Down Syndrome by multiple marker screen and mild polyhydramnios seen on detailed ultrasound at 27 weeks. She previously had genetic counseling and elected for cell-free fetal DNA testing, which was normal.  She also reports normal diabetes screening.  Ultrasound demonstrates a single live pregnancy at 36 2/7 weeks. Dating is by LMP consistent with earliest available ultrasound perfomred at [facility] on 11/20/14 with measurements of 18 4/7 weeks.  Fetus is cephalic. AFI 17.2  NST 130s  reactive no contractions  Continue weekly testing.

## 2015-03-27 ENCOUNTER — Encounter: Payer: Self-pay | Admitting: Obstetrics and Gynecology

## 2015-03-27 ENCOUNTER — Ambulatory Visit (INDEPENDENT_AMBULATORY_CARE_PROVIDER_SITE_OTHER): Payer: Medicaid Other | Admitting: Obstetrics and Gynecology

## 2015-03-27 VITALS — BP 122/78 | HR 83 | Wt 230.8 lb

## 2015-03-27 DIAGNOSIS — O99013 Anemia complicating pregnancy, third trimester: Secondary | ICD-10-CM

## 2015-03-27 DIAGNOSIS — O403XX4 Polyhydramnios, third trimester, fetus 4: Secondary | ICD-10-CM

## 2015-03-27 DIAGNOSIS — Z720 Tobacco use: Secondary | ICD-10-CM

## 2015-03-27 DIAGNOSIS — Z3483 Encounter for supervision of other normal pregnancy, third trimester: Secondary | ICD-10-CM

## 2015-03-27 DIAGNOSIS — Z36 Encounter for antenatal screening of mother: Secondary | ICD-10-CM

## 2015-03-27 DIAGNOSIS — F1210 Cannabis abuse, uncomplicated: Secondary | ICD-10-CM

## 2015-03-27 DIAGNOSIS — Z3493 Encounter for supervision of normal pregnancy, unspecified, third trimester: Secondary | ICD-10-CM

## 2015-03-27 DIAGNOSIS — Z369 Encounter for antenatal screening, unspecified: Secondary | ICD-10-CM

## 2015-03-27 DIAGNOSIS — F1290 Cannabis use, unspecified, uncomplicated: Secondary | ICD-10-CM

## 2015-03-27 LAB — POCT URINALYSIS DIPSTICK
BILIRUBIN UA: NEGATIVE
Glucose, UA: NEGATIVE
Ketones, UA: NEGATIVE
NITRITE UA: NEGATIVE
Spec Grav, UA: 1.015
Urobilinogen, UA: 0.2
pH, UA: 6.5

## 2015-03-27 NOTE — Progress Notes (Signed)
ROB: Patient doing well.  Still complains of leg swelling.  Is using compression hose.  Patient seen in L&D triage last week for swelling, given a dose of Lasix which she notes helped some.  Continue to encourage leg elevation while resting.  36 week labs done today.  Discussed Pregnancy Care Management Program. Patient completed form.  Diagnosed with polyhydramnios on 35 week scan with [facility].  Is undergoing surveillance with weekly NSTs. Continued to counsel on smoking cessation. RTC in 1 week.

## 2015-03-28 LAB — OB RESULTS CONSOLE GBS: GBS: NEGATIVE

## 2015-03-28 LAB — OB RESULTS CONSOLE GC/CHLAMYDIA
Chlamydia: NEGATIVE
GC PROBE AMP, GENITAL: NEGATIVE

## 2015-03-30 ENCOUNTER — Observation Stay
Admission: EM | Admit: 2015-03-30 | Discharge: 2015-03-30 | Disposition: A | Discharge status: Home / Self Care | Payer: Medicaid Other | Attending: Obstetrics and Gynecology | Admitting: Obstetrics and Gynecology

## 2015-03-30 DIAGNOSIS — O26899 Other specified pregnancy related conditions, unspecified trimester: Principal | ICD-10-CM | POA: Insufficient documentation

## 2015-03-30 MED ORDER — LACTATED RINGERS IV SOLN: INTRAVENOUS | Status: DC

## 2015-03-30 NOTE — Progress Notes (Signed)
Pt given discharge instructions and ambulatory with ED tech off the unit.

## 2015-03-30 NOTE — OB Triage Note (Signed)
Nitrazine negative 

## 2015-03-31 ENCOUNTER — Inpatient Hospital Stay: Admission: RE | Admit: 2015-03-31 | Payer: Medicaid Other | Source: Ambulatory Visit

## 2015-04-01 LAB — CULTURE, BETA STREP (GROUP B ONLY): STREP GP B CULTURE: NEGATIVE

## 2015-04-01 LAB — GC/CHLAMYDIA PROBE AMP
Chlamydia trachomatis, NAA: NEGATIVE
Neisseria gonorrhoeae by PCR: NEGATIVE

## 2015-04-01 LAB — PLEASE NOTE

## 2015-04-04 ENCOUNTER — Ambulatory Visit (INDEPENDENT_AMBULATORY_CARE_PROVIDER_SITE_OTHER): Payer: Medicaid Other | Admitting: Obstetrics and Gynecology

## 2015-04-04 ENCOUNTER — Other Ambulatory Visit: Payer: Self-pay | Admitting: Obstetrics and Gynecology

## 2015-04-04 ENCOUNTER — Other Ambulatory Visit: Payer: Medicaid Other

## 2015-04-04 VITALS — BP 132/82 | HR 84 | Wt 233.0 lb

## 2015-04-04 DIAGNOSIS — Z1389 Encounter for screening for other disorder: Secondary | ICD-10-CM

## 2015-04-04 DIAGNOSIS — R319 Hematuria, unspecified: Secondary | ICD-10-CM

## 2015-04-04 DIAGNOSIS — R809 Proteinuria, unspecified: Secondary | ICD-10-CM

## 2015-04-04 DIAGNOSIS — Z331 Pregnant state, incidental: Secondary | ICD-10-CM

## 2015-04-04 LAB — POCT URINALYSIS DIPSTICK
BILIRUBIN UA: NEGATIVE
GLUCOSE UA: NEGATIVE
Ketones, UA: NEGATIVE
NITRITE UA: NEGATIVE
PH UA: 6.5
SPEC GRAV UA: 1.02
Urobilinogen, UA: 0.2

## 2015-04-04 NOTE — Progress Notes (Signed)
ZIKA EXPOSURE SCREEN:  The patient has not traveled to a Zika Virus endemic area within the past 6 months, nor has she had unprotected sex with a partner who has travelled to a Zika endemic region within the past 6 months. The patient has been advised to notify us if these factors change any time during this current pregnancy, so adequate testing and monitoring can be initiated.  

## 2015-04-04 NOTE — Progress Notes (Signed)
ROB: Patient doing well. Reports mild occasional contractions.  Declines cervical check.  Still notes smoking 1 blunt per day for appetite stimulation. Still smokes an occasional cigarette.   Labor precautions given. BPs slightly elevated above patient's norm, but still wnl.  No change in proteinuria since last visit.  RTC in 1 week.

## 2015-04-09 LAB — URINE CULTURE

## 2015-04-10 ENCOUNTER — Ambulatory Visit
Admission: RE | Admit: 2015-04-10 | Discharge: 2015-04-10 | Disposition: A | Discharge status: Home / Self Care | Payer: Medicaid Other | Source: Ambulatory Visit | Attending: Obstetrics & Gynecology | Admitting: Obstetrics & Gynecology

## 2015-04-10 ENCOUNTER — Ambulatory Visit (INDEPENDENT_AMBULATORY_CARE_PROVIDER_SITE_OTHER): Payer: Medicaid Other | Admitting: Obstetrics and Gynecology

## 2015-04-10 VITALS — BP 125/77 | HR 84 | Wt 226.6 lb

## 2015-04-10 DIAGNOSIS — O403XX0 Polyhydramnios, third trimester, not applicable or unspecified: Secondary | ICD-10-CM | POA: Insufficient documentation

## 2015-04-10 DIAGNOSIS — Z3493 Encounter for supervision of normal pregnancy, unspecified, third trimester: Secondary | ICD-10-CM

## 2015-04-10 DIAGNOSIS — Z3483 Encounter for supervision of other normal pregnancy, third trimester: Secondary | ICD-10-CM

## 2015-04-10 LAB — POCT URINALYSIS DIPSTICK
Bilirubin, UA: NEGATIVE
Glucose, UA: NEGATIVE
Ketones, UA: 80
Nitrite, UA: NEGATIVE
PH UA: 6.5
SPEC GRAV UA: 1.015
UROBILINOGEN UA: NEGATIVE

## 2015-04-10 LAB — US OB FOLLOW UP

## 2015-04-10 NOTE — Progress Notes (Signed)
ROB: Patient complains of infrequent contractions however worse in intensity from last visit.  Cervix essentially unchanged.  Labor precautions.  Has appt with [facility] today to f/u with polyhydramnios.

## 2015-04-14 ENCOUNTER — Telehealth: Payer: Self-pay

## 2015-04-14 DIAGNOSIS — B958 Unspecified staphylococcus as the cause of diseases classified elsewhere: Secondary | ICD-10-CM

## 2015-04-14 MED ORDER — NITROFURANTOIN MONOHYD MACRO 100 MG PO CAPS: 100.0000 mg | ORAL_CAPSULE | Freq: Two times a day (BID) | ORAL | Status: DC

## 2015-04-14 NOTE — Telephone Encounter (Signed)
-----   Message from Hildred LaserAnika Cherry, MD sent at 04/13/2015  4:24 PM EDT ----- Please inform patient of UTI (Staphylococcus saprophyticus).  Needs treatment with Macrobid.

## 2015-04-14 NOTE — Telephone Encounter (Signed)
Called pt informed her of UTI, RX sent in.

## 2015-04-17 ENCOUNTER — Ambulatory Visit (INDEPENDENT_AMBULATORY_CARE_PROVIDER_SITE_OTHER): Payer: Medicaid Other | Admitting: Obstetrics and Gynecology

## 2015-04-17 ENCOUNTER — Ambulatory Visit
Admission: RE | Admit: 2015-04-17 | Discharge: 2015-04-17 | Disposition: A | Discharge status: Home / Self Care | Payer: Medicaid Other | Source: Ambulatory Visit | Attending: Maternal & Fetal Medicine | Admitting: Maternal & Fetal Medicine

## 2015-04-17 VITALS — BP 123/73 | HR 87 | Wt 231.6 lb

## 2015-04-17 DIAGNOSIS — F1290 Cannabis use, unspecified, uncomplicated: Secondary | ICD-10-CM

## 2015-04-17 DIAGNOSIS — Z3A39 39 weeks gestation of pregnancy: Secondary | ICD-10-CM | POA: Insufficient documentation

## 2015-04-17 DIAGNOSIS — Z720 Tobacco use: Secondary | ICD-10-CM

## 2015-04-17 DIAGNOSIS — O99013 Anemia complicating pregnancy, third trimester: Secondary | ICD-10-CM

## 2015-04-17 DIAGNOSIS — O403XX4 Polyhydramnios, third trimester, fetus 4: Secondary | ICD-10-CM

## 2015-04-17 DIAGNOSIS — F1210 Cannabis abuse, uncomplicated: Secondary | ICD-10-CM

## 2015-04-17 DIAGNOSIS — O403XX0 Polyhydramnios, third trimester, not applicable or unspecified: Secondary | ICD-10-CM | POA: Insufficient documentation

## 2015-04-17 NOTE — Progress Notes (Signed)
Pt unable to void. Voided prior to visit with MD.

## 2015-04-18 NOTE — Progress Notes (Signed)
ROB: Patient complains of fatigue.  Denies contractions. Declines cervical exam.  S/p visit to [facility] earlier today.  Recommend delivery at 40 weeks. NST reactive.  Patient scheduled for IOL on 05/23/2015.

## 2015-04-19 ENCOUNTER — Encounter: Payer: Self-pay | Admitting: *Deleted

## 2015-04-19 ENCOUNTER — Inpatient Hospital Stay: Payer: Medicaid Other | Admitting: Anesthesiology

## 2015-04-19 ENCOUNTER — Inpatient Hospital Stay
Admission: EM | Admit: 2015-04-19 | Discharge: 2015-04-21 | DRG: 775 | Disposition: A | Discharge status: Home / Self Care | Payer: Medicaid Other | Attending: Obstetrics and Gynecology | Admitting: Obstetrics and Gynecology

## 2015-04-19 DIAGNOSIS — F17210 Nicotine dependence, cigarettes, uncomplicated: Secondary | ICD-10-CM | POA: Diagnosis present

## 2015-04-19 DIAGNOSIS — F1210 Cannabis abuse, uncomplicated: Secondary | ICD-10-CM | POA: Diagnosis present

## 2015-04-19 DIAGNOSIS — O99334 Smoking (tobacco) complicating childbirth: Secondary | ICD-10-CM | POA: Diagnosis present

## 2015-04-19 DIAGNOSIS — Z823 Family history of stroke: Secondary | ICD-10-CM | POA: Diagnosis not present

## 2015-04-19 DIAGNOSIS — O99324 Drug use complicating childbirth: Secondary | ICD-10-CM | POA: Diagnosis present

## 2015-04-19 DIAGNOSIS — Z3A40 40 weeks gestation of pregnancy: Secondary | ICD-10-CM | POA: Diagnosis present

## 2015-04-19 DIAGNOSIS — O403XX0 Polyhydramnios, third trimester, not applicable or unspecified: Principal | ICD-10-CM | POA: Diagnosis present

## 2015-04-19 DIAGNOSIS — Z809 Family history of malignant neoplasm, unspecified: Secondary | ICD-10-CM

## 2015-04-19 DIAGNOSIS — Z79899 Other long term (current) drug therapy: Secondary | ICD-10-CM | POA: Diagnosis not present

## 2015-04-19 DIAGNOSIS — Z3483 Encounter for supervision of other normal pregnancy, third trimester: Secondary | ICD-10-CM

## 2015-04-19 DIAGNOSIS — O409XX0 Polyhydramnios, unspecified trimester, not applicable or unspecified: Secondary | ICD-10-CM | POA: Diagnosis present

## 2015-04-19 LAB — TYPE AND SCREEN
ABO/RH(D): B POS
Antibody Screen: NEGATIVE

## 2015-04-19 LAB — CBC
HCT: 31.7 % — ABNORMAL LOW (ref 35.0–47.0)
HEMOGLOBIN: 10.8 g/dL — AB (ref 12.0–16.0)
MCH: 30.9 pg (ref 26.0–34.0)
MCHC: 33.9 g/dL (ref 32.0–36.0)
MCV: 91.1 fL (ref 80.0–100.0)
Platelets: 296 10*3/uL (ref 150–440)
RBC: 3.49 MIL/uL — ABNORMAL LOW (ref 3.80–5.20)
RDW: 14 % (ref 11.5–14.5)
WBC: 9.1 10*3/uL (ref 3.6–11.0)

## 2015-04-19 LAB — COMPREHENSIVE METABOLIC PANEL
ALT: 10 U/L — AB (ref 14–54)
ANION GAP: 9 (ref 5–15)
AST: 17 U/L (ref 15–41)
Albumin: 2.9 g/dL — ABNORMAL LOW (ref 3.5–5.0)
Alkaline Phosphatase: 163 U/L — ABNORMAL HIGH (ref 38–126)
BUN: 6 mg/dL (ref 6–20)
CALCIUM: 8.6 mg/dL — AB (ref 8.9–10.3)
CO2: 21 mmol/L — ABNORMAL LOW (ref 22–32)
CREATININE: 0.45 mg/dL (ref 0.44–1.00)
Chloride: 107 mmol/L (ref 101–111)
GFR calc non Af Amer: >60 mL/min (ref >60)
GLUCOSE: 80 mg/dL (ref 65–99)
Potassium: 3.2 mmol/L — ABNORMAL LOW (ref 3.5–5.1)
Sodium: 137 mmol/L (ref 135–145)
TOTAL PROTEIN: 6.4 g/dL — AB (ref 6.5–8.1)
Total Bilirubin: 1.2 mg/dL (ref 0.3–1.2)

## 2015-04-19 LAB — ABO/RH: ABO/RH(D): B POS

## 2015-04-19 LAB — PROTEIN / CREATININE RATIO, URINE
Creatinine, Urine: 24 mg/dL
Total Protein, Urine: <6 mg/dL

## 2015-04-19 MED ORDER — OXYCODONE-ACETAMINOPHEN 5-325 MG PO TABS
1.0000 | ORAL_TABLET | ORAL | Status: DC | PRN
  Administered 2015-04-20 (×4): 1 via ORAL
  Filled 2015-04-19 (×3): qty 1
  Filled 2015-04-19: qty 2
  Filled 2015-04-19 (×3): qty 1

## 2015-04-19 MED ORDER — LACTATED RINGERS IV SOLN: INTRAVENOUS | Status: DC

## 2015-04-19 MED ORDER — SIMETHICONE 80 MG PO CHEW: 80.0000 mg | CHEWABLE_TABLET | ORAL | Status: DC | PRN

## 2015-04-19 MED ORDER — LIDOCAINE HCL (PF) 1 % IJ SOLN
30.0000 mL | INTRAMUSCULAR | Status: DC | PRN
  Filled 2015-04-19: qty 30

## 2015-04-19 MED ORDER — BUTORPHANOL TARTRATE 1 MG/ML IJ SOLN: 1.0000 mg | INTRAMUSCULAR | Status: DC | PRN

## 2015-04-19 MED ORDER — LACTATED RINGERS IV SOLN: 500.0000 mL | INTRAVENOUS | Status: DC | PRN

## 2015-04-19 MED ORDER — PHENYLEPHRINE 40 MCG/ML (10ML) SYRINGE FOR IV PUSH (FOR BLOOD PRESSURE SUPPORT)
80.0000 ug | PREFILLED_SYRINGE | INTRAVENOUS | Status: DC | PRN
  Filled 2015-04-19: qty 2

## 2015-04-19 MED ORDER — OXYTOCIN 40 UNITS IN LACTATED RINGERS INFUSION - SIMPLE MED
62.5000 mL/h | INTRAVENOUS | Status: DC
  Administered 2015-04-19: 62.5 mL/h via INTRAVENOUS
  Filled 2015-04-19 (×2): qty 1000

## 2015-04-19 MED ORDER — DOCUSATE SODIUM 100 MG PO CAPS
100.0000 mg | ORAL_CAPSULE | Freq: Two times a day (BID) | ORAL | Status: DC
  Administered 2015-04-19 – 2015-04-21 (×4): 100 mg via ORAL
  Filled 2015-04-19 (×5): qty 1

## 2015-04-19 MED ORDER — CITRIC ACID-SODIUM CITRATE 334-500 MG/5ML PO SOLN: 30.0000 mL | ORAL | Status: DC | PRN

## 2015-04-19 MED ORDER — ONDANSETRON HCL 4 MG PO TABS: 4.0000 mg | ORAL_TABLET | ORAL | Status: DC | PRN

## 2015-04-19 MED ORDER — DIBUCAINE 1 % RE OINT: 1.0000 "application " | TOPICAL_OINTMENT | RECTAL | Status: DC | PRN

## 2015-04-19 MED ORDER — LACTATED RINGERS IV SOLN
INTRAVENOUS | Status: DC
  Administered 2015-04-19 (×2): via INTRAVENOUS

## 2015-04-19 MED ORDER — ACETAMINOPHEN 325 MG PO TABS: 650.0000 mg | ORAL_TABLET | ORAL | Status: DC | PRN

## 2015-04-19 MED ORDER — OXYTOCIN 10 UNIT/ML IJ SOLN
INTRAMUSCULAR | Status: AC
  Filled 2015-04-19: qty 2

## 2015-04-19 MED ORDER — EPHEDRINE 5 MG/ML INJ
10.0000 mg | INTRAVENOUS | Status: DC | PRN
  Filled 2015-04-19: qty 2

## 2015-04-19 MED ORDER — BUPIVACAINE-EPINEPHRINE (PF) 0.25% -1:200000 IJ SOLN
INTRAMUSCULAR | Status: DC | PRN
  Administered 2015-04-19: 5 mL

## 2015-04-19 MED ORDER — IBUPROFEN 800 MG PO TABS
800.0000 mg | ORAL_TABLET | Freq: Four times a day (QID) | ORAL | Status: DC
  Administered 2015-04-19 – 2015-04-21 (×6): 800 mg via ORAL
  Filled 2015-04-19 (×7): qty 1

## 2015-04-19 MED ORDER — OXYTOCIN 40 UNITS IN LACTATED RINGERS INFUSION - SIMPLE MED: 62.5000 mL/h | INTRAVENOUS | Status: DC | PRN

## 2015-04-19 MED ORDER — LIDOCAINE HCL (PF) 1 % IJ SOLN
INTRAMUSCULAR | Status: AC
  Filled 2015-04-19: qty 30

## 2015-04-19 MED ORDER — FERROUS SULFATE 325 (65 FE) MG PO TABS
325.0000 mg | ORAL_TABLET | Freq: Two times a day (BID) | ORAL | Status: DC
  Administered 2015-04-20 – 2015-04-21 (×3): 325 mg via ORAL
  Filled 2015-04-19 (×3): qty 1

## 2015-04-19 MED ORDER — PRENATAL MULTIVITAMIN CH
1.0000 | ORAL_TABLET | Freq: Every day | ORAL | Status: DC
  Administered 2015-04-20 – 2015-04-21 (×2): 1 via ORAL
  Filled 2015-04-19 (×2): qty 1

## 2015-04-19 MED ORDER — OXYCODONE-ACETAMINOPHEN 5-325 MG PO TABS
2.0000 | ORAL_TABLET | ORAL | Status: DC | PRN
  Administered 2015-04-21 (×2): 2 via ORAL

## 2015-04-19 MED ORDER — FENTANYL 2.5 MCG/ML W/ROPIVACAINE 0.2% IN NS 100 ML EPIDURAL INFUSION (ARMC-ANES)
9.0000 mL/h | EPIDURAL | Status: DC
  Administered 2015-04-19 (×2): 9 mL/h via EPIDURAL
  Filled 2015-04-19: qty 100

## 2015-04-19 MED ORDER — ZOLPIDEM TARTRATE 5 MG PO TABS: 5.0000 mg | ORAL_TABLET | Freq: Every evening | ORAL | Status: DC | PRN

## 2015-04-19 MED ORDER — ONDANSETRON HCL 4 MG/2ML IJ SOLN: 4.0000 mg | INTRAMUSCULAR | Status: DC | PRN

## 2015-04-19 MED ORDER — HYDROCODONE-ACETAMINOPHEN 5-325 MG PO TABS
2.0000 | ORAL_TABLET | ORAL | Status: DC | PRN
  Filled 2015-04-19: qty 1

## 2015-04-19 MED ORDER — AMMONIA AROMATIC IN INHA
RESPIRATORY_TRACT | Status: AC
  Filled 2015-04-19: qty 10

## 2015-04-19 MED ORDER — LANOLIN HYDROUS EX OINT: TOPICAL_OINTMENT | CUTANEOUS | Status: DC | PRN

## 2015-04-19 MED ORDER — SODIUM CHLORIDE 0.9 % IV SOLN
2.0000 g | Freq: Once | INTRAVENOUS | Status: AC
  Administered 2015-04-19: 2 g via INTRAVENOUS
  Filled 2015-04-19: qty 2000

## 2015-04-19 MED ORDER — WITCH HAZEL-GLYCERIN EX PADS: 1.0000 "application " | MEDICATED_PAD | CUTANEOUS | Status: DC | PRN

## 2015-04-19 MED ORDER — BENZOCAINE-MENTHOL 20-0.5 % EX AERO
1.0000 "application " | INHALATION_SPRAY | CUTANEOUS | Status: DC | PRN
  Administered 2015-04-20: 1 via TOPICAL
  Filled 2015-04-19: qty 56

## 2015-04-19 MED ORDER — DIPHENHYDRAMINE HCL 25 MG PO CAPS: 25.0000 mg | ORAL_CAPSULE | Freq: Four times a day (QID) | ORAL | Status: DC | PRN

## 2015-04-19 MED ORDER — OXYTOCIN BOLUS FROM INFUSION: 500.0000 mL | INTRAVENOUS | Status: DC

## 2015-04-19 MED ORDER — ONDANSETRON HCL 4 MG/2ML IJ SOLN: 4.0000 mg | Freq: Four times a day (QID) | INTRAMUSCULAR | Status: DC | PRN

## 2015-04-19 MED ORDER — DIPHENHYDRAMINE HCL 50 MG/ML IJ SOLN: 12.5000 mg | INTRAMUSCULAR | Status: DC | PRN

## 2015-04-19 NOTE — Anesthesia Preprocedure Evaluation (Signed)
Anesthesia Evaluation  Patient identified by MRN, date of birth, ID band Patient awake    Reviewed: Allergy & Precautions, NPO status , Patient's Chart, lab work & pertinent test results, reviewed documented beta blocker date and time   Airway Mallampati: II  TM Distance: >3 FB     Dental  (+) Chipped   Pulmonary Current Smoker,          Cardiovascular     Neuro/Psych  Headaches,    GI/Hepatic   Endo/Other    Renal/GU      Musculoskeletal   Abdominal   Peds  Hematology  (+) anemia ,   Anesthesia Other Findings   Reproductive/Obstetrics                             Anesthesia Physical Anesthesia Plan  ASA: II  Anesthesia Plan: Epidural   Post-op Pain Management:    Induction:   Airway Management Planned:   Additional Equipment:   Intra-op Plan:   Post-operative Plan:   Informed Consent: I have reviewed the patients History and Physical, chart, labs and discussed the procedure including the risks, benefits and alternatives for the proposed anesthesia with the patient or authorized representative who has indicated his/her understanding and acceptance.     Plan Discussed with: CRNA  Anesthesia Plan Comments:         Anesthesia Quick Evaluation

## 2015-04-19 NOTE — H&P (Signed)
Obstetric History and Physical  Kathryn Livingston is a 29 y.o. [phone] with IUP at [redacted]w[redacted]d presenting for onset of labor. Patient states she has been having  irregular, every 7-10 minutes contractions, none vaginal bleeding, intact membranes, with active fetal movement.    Prenatal Course Source of Care: [facility]  with onset of care at 13 weeks Pregnancy complications or risks: Patient Active Problem List   Diagnosis Date Noted  . Polyhydramnios 04/19/2015  . Indication for care in labor or delivery 03/30/2015  . Labor and delivery, indication for care 03/20/2015  . Polyhydramnios in third trimester 03/17/2015  . Continuous tobacco abuse 02/26/2015  . Marijuana use 02/26/2015  . Low weight gain in pregnancy 02/26/2015  . Headache in pregnancy 02/26/2015  . Abnormal quad screen 02/26/2015  . Anemia in pregnancy 02/26/2015   She plans to breastfeed and bottle feed She desires Nexplanon for postpartum contraception.   Prenatal labs and studies: ABO, Rh: --/--/PENDING (07/23 1103) Antibody: PENDING (07/23 1103) Rubella: Immune (02/19 0000) RPR: Nonreactive (02/19 0000)  HBsAg: Negative (02/19 0000)  HIV: Non-reactive (02/19 0000)  AVW:UJWJXBJY (07/01 0000) 1 hr Glucola  normal Genetic screening abnormal with increased risk for Down's Syndrome on quad screen, normal InformaSeq Anatomy US normal  Prenatal Transfer Tool  Maternal Diabetes: No Genetic Screening: Abnormal:  Results: Elevated risk of Trisomy 21 on Quad screen.  Normal InformaSeq Maternal Ultrasounds/Referrals: Abnormal:  Findings:   Other:Polyhydramnios Fetal Ultrasounds or other Referrals:  Referred to Materal Fetal Medicine  Maternal Substance Abuse:  Yes:  Type: Smoker, Marijuana Significant Maternal Medications:  None Significant Maternal Lab Results: Lab values include: Group B Strep negative  Past Medical History  Diagnosis Date  . Headache   . Breast abscess   . Tobacco use in pregnancy   . Low  maternal weight gain   . Obesity     History reviewed. No pertinent past surgical history.  OB History  Gravida Para Term Preterm AB SAB TAB Ectopic Multiple Living  0 0 0 0 0 0 3    # Outcome Date GA Lbr Len/2nd Weight Sex Delivery Anes PTL Lv  4 Current           3 Term 06/2007   5 lb 5 oz (2.41 kg) F Vag-Spont  N Y  2 Term 09/2005   5 lb 4 oz (2.381 kg) F Vag-Spont  N Y  1 Term 12/2003   5 lb 13 oz (2.637 kg) F Vag-Spont  N Y      History   Social History  . Marital Status: Single    Spouse Name: N/A  . Number of Children: N/A  . Years of Education: N/A   Social History Main Topics  . Smoking status: Current Every Day Smoker -- 0.50 packs/day for 15 years    Types: Cigarettes  . Smokeless tobacco: Never Used  . Alcohol Use: No  . Drug Use: 7.00 per week    Special: Marijuana  . Sexual Activity: Yes   Other Topics Concern  . None   Social History Narrative    Family History  Problem Relation Age of Onset  . Stroke Father   . Cancer Paternal Aunt     Prescriptions prior to admission  Medication Sig Dispense Refill Last Dose  . acetaminophen-codeine (TYLENOL #3) 300-30 MG per tablet Take 1-2 tablets by mouth every 6 (six) hours as needed for moderate pain (headaches). 40 tablet 0 Past Week at Unknown time  .  docusate sodium (COLACE) 100 MG capsule Take 100 mg by mouth 2 (two) times daily.   04/18/2015 at Unknown time  . ferrous fumarate (HEMOCYTE - 106 MG FE) 325 (106 FE) MG TABS tablet Take 1 tablet by mouth.   04/18/2015 at Unknown time  . nitrofurantoin, macrocrystal-monohydrate, (MACROBID) 100 MG capsule Take 1 capsule (100 mg total) by mouth 2 (two) times daily. 14 capsule 0 04/18/2015 at Unknown time  . Prenat-FeFum-FePo-FA-Omega 3 (CONCEPT DHA) 53.5-38-1 MG CAPS   1 04/18/2015 at Unknown time  . Butalbital-APAP-Caffeine 50-300-40 MG CAPS   0 Not Taking at Unknown time    No Known Allergies  Review of Systems: Negative except for what is mentioned in  HPI.  Physical Exam: Temp:  [98.2 F (36.8 C)] 98.2 F (36.8 C) (07/23 0935) Pulse Rate:  [77-88] 77 (07/23 1131) Resp:  [16-18] 18 (07/23 1015) BP: (119-154)/(64-109) 136/64 mmHg (07/23 1131) Weight:  [231 lb (104.781 kg)] 231 lb (104.781 kg) (07/23 0935)   CONSTITUTIONAL: Well-developed, well-nourished female in mild distress.  HENT:  Normocephalic, atraumatic, External right and left ear normal. Oropharynx is clear and moist EYES: Conjunctivae and EOM are normal. Pupils are equal, round, and reactive to light. No scleral icterus.  NECK: Normal range of motion, supple, no masses SKIN: Skin is warm and dry. No rash noted. Not diaphoretic. No erythema. No pallor. NEUROLGIC: Alert and oriented to person, place, and time. Normal reflexes, muscle tone coordination. No cranial nerve deficit noted. PSYCHIATRIC: Normal mood and affect. Normal behavior. Normal judgment and thought content. CARDIOVASCULAR: Normal heart rate noted, regular rhythm RESPIRATORY: Effort and breath sounds normal, no problems with respiration noted ABDOMEN: Soft, nontender, nondistended, gravid. MUSCULOSKELETAL: Normal range of motion. No edema and no tenderness. 2+ distal pulses.  Cervical Exam: Dilatation 4cm   Effacement 80%   Station -3  Presentation: cephalic FHT:  Baseline rate 140 bpm   Variability moderate  Accelerations present   Decelerations none Contractions: Every 5-7 mins   Pertinent Labs/Studies:   Results for orders placed or performed during the hospital encounter of 04/19/15 (from the past 24 hour(s))  CBC     Status: Abnormal   Collection Time: 04/19/15 11:03 AM  Result Value Ref Range   WBC 9.1 3.6 - 11.0 K/uL   RBC 3.49 (L) 3.80 - 5.20 MIL/uL   Hemoglobin 10.8 (L) 12.0 - 16.0 g/dL   HCT 16.1 (L) 09.6 - 04.5 %   MCV 91.1 80.0 - 100.0 fL   MCH 30.9 26.0 - 34.0 pg   MCHC 33.9 32.0 - 36.0 g/dL   RDW 40.9 81.1 - 91.4 %   Platelets 296 150 - 440 K/uL  Type and screen     Status: None  (Preliminary result)   Collection Time: 04/19/15 11:03 AM  Result Value Ref Range   ABO/RH(D) PENDING    Antibody Screen PENDING    Sample Expiration 04/22/2015     Assessment : Kathryn Livingston is a 29 y.o. G4P3003 at [redacted]w[redacted]d being admitted for labor. Polyhydramnios (last scan 2 days ago with AFI of 20.1 cm) Elevated BPs  Plan: Labor: Expectant management.  Augmentation as needed, per protocol FWB: Reassuring fetal heart tracing.  GBS negative Delivery plan: Hopeful for vaginal delivery No h/o HTN in pregnancy.  Unsure if due to pain or PIH.  Will order PIH labs.  Patient desires epidural.    Hildred Laser, MD [facility]

## 2015-04-19 NOTE — Anesthesia Procedure Notes (Signed)
Epidural Patient location during procedure: OB  Staffing Anesthesiologist: Berdine Addison Performed by: anesthesiologist   Preanesthetic Checklist Completed: patient identified, site marked, surgical consent, pre-op evaluation, timeout performed, IV checked, risks and benefits discussed and monitors and equipment checked  Epidural Patient position: sitting Prep: Betadine Patient monitoring: heart rate, continuous pulse ox and blood pressure Approach: midline Location: L4-L5 Injection technique: LOR saline  Needle:  Needle type: Tuohy  Needle gauge: 18 G Needle length: 9 cm and 9 Catheter type: closed end flexible Catheter size: 20 Guage Test dose: negative and 1.5% lidocaine with Epi 1:200 K  Assessment Sensory level: T10 Events: blood not aspirated, injection not painful, no injection resistance, negative IV test and no paresthesia  Additional Notes   Patient tolerated the insertion well without complications. Cath insertion 1220. Test 1222. Bolus 5 ml .25% marcaine at 1224. Start infusion 1227.Reason for block:procedure for pain

## 2015-04-19 NOTE — OB Triage Note (Signed)
Recvd on stretcher per EMS c/o contractions that woke her up this morning.  Term IUP with no other complaints.  Changed to gown and to bed.  EFM applied.  Plan of care discussed and oriented to room.  Verbalized understanding.

## 2015-04-19 NOTE — Progress Notes (Signed)
Transfer safely to Rm 337 per wheelchair with s/o at side.

## 2015-04-19 NOTE — Progress Notes (Signed)
Intrapartum Progress Note  S: Patient beginning to feel uncomfortable with contractions, despite epidural. Just SROM'd.   O: Blood pressure 94/70, pulse 82, temperature 98.2 F (36.8 C), temperature source Oral, resp. rate 18, height  (1.676 m), weight 231 lb (104.781 kg), last menstrual period 07/13/2014. Gen App: NAD, mild distress Abdomen: soft, gravid FHT: difficulty tracing after SROM, IUPC placed. FHT 145 bpm.  Accels present, no decels, moderate variability. Tocometer:Ctx q 3-5 min Cervix: 9/80/0/SROM clear fluid Extremities: Nontender, no edema.  Labs:  Lab Results  Component Value Date   WBC 9.1 04/19/2015   HGB 10.8* 04/19/2015   HCT 31.7* 04/19/2015   MCV 91.1 04/19/2015   PLT 296 04/19/2015   PIH labs negative  Assessment:  1: SIUP at [redacted]w[redacted]d 2. Active labor 3. Polyhydramnios 4. SROM  Plan:  1. IUPC in place for better monitoring of FHT.  2. Anticipate vaginal delivery soon   Hildred Laser, MD [facility]

## 2015-04-20 LAB — CBC
HEMATOCRIT: 30.6 % — AB (ref 35.0–47.0)
HEMOGLOBIN: 10.2 g/dL — AB (ref 12.0–16.0)
MCH: 30.3 pg (ref 26.0–34.0)
MCHC: 33.1 g/dL (ref 32.0–36.0)
MCV: 91.4 fL (ref 80.0–100.0)
PLATELETS: 281 10*3/uL (ref 150–440)
RBC: 3.35 MIL/uL — ABNORMAL LOW (ref 3.80–5.20)
RDW: 14 % (ref 11.5–14.5)
WBC: 12.8 10*3/uL — ABNORMAL HIGH (ref 3.6–11.0)

## 2015-04-20 LAB — RPR: RPR: NONREACTIVE

## 2015-04-20 MED ORDER — IBUPROFEN 800 MG PO TABS
800.0000 mg | ORAL_TABLET | Freq: Four times a day (QID) | ORAL | Status: DC
Start: 2015-04-20 — End: 2015-07-06

## 2015-04-20 NOTE — Discharge Summary (Addendum)
Obstetric Discharge Summary Reason for Admission: onset of labor Prenatal Procedures: NST and ultrasound Intrapartum Procedures: spontaneous vaginal delivery Postpartum Procedures: none Complications-Operative and Postpartum: none HEMOGLOBIN  Date Value Ref Range Status  04/20/2015 10.2* 12.0 - 16.0 g/dL Final   HCT  Date Value Ref Range Status  04/20/2015 30.6* 35.0 - 47.0 % Final    Physical Exam:  General: alert and no distress Lochia: appropriate Uterine Fundus: firm Incision: none DVT Evaluation: No evidence of DVT seen on physical exam. No cords or calf tenderness. No significant calf/ankle edema.  Discharge Diagnoses: Term Pregnancy-delivered and Polyhdramnios  Discharge Information: Date: 04/21/2015 Activity: pelvic rest Diet: routine Medications: PNV and Ibuprofen Condition: stable Instructions: refer to practice specific booklet Discharge to: home    Follow-up Information    Follow up with Hildred Laser, MD In 6 weeks.   Specialties:  Obstetrics and Gynecology, Radiology   Why:  postpartum visit   Contact information:   [address] [place] [address] [phone]       Newborn Data: Live born female  Birth Weight: 6 lb 8.2 oz (2954 g) APGAR: 9, 9  Home with mother.  Hildred Laser 04/20/2015, 11:14 AM

## 2015-04-20 NOTE — Progress Notes (Signed)
Post Partum Day 1  Subjective: no complaints, up ad lib, voiding and tolerating PO.  Objective: Temp:  [97.8 F (36.6 C)-98.5 F (36.9 C)] 97.8 F (36.6 C) (07/24 0719) Pulse Rate:  [57-89] 89 (07/24 0719) Resp:  [18] 18 (07/24 0509) BP: (94-155)/(61-124) 123/63 mmHg (07/24 0719) SpO2:  [97 %] 97 % (07/24 0719)  Physical Exam:  General: alert and no distress Lochia: appropriate Uterine Fundus: firm Incision: none DVT Evaluation: No evidence of DVT seen on physical exam. No cords or calf tenderness. No significant calf/ankle edema.   Recent Labs  04/19/15 1103 04/20/15 0529  HGB 10.8* 10.2*  HCT 31.7* 30.6*    Assessment/Plan: Contraception Nexplanon.   Bottle feeding.  Desires to go home today. Can discharge home this afternoon.    LOS: 1 day   Hildred Laser 04/20/2015, 11:06 AM

## 2015-04-20 NOTE — Discharge Instructions (Signed)

## 2015-04-20 NOTE — Anesthesia Postprocedure Evaluation (Signed)
  Anesthesia Post-op Note  Patient: Kathryn Livingston  Procedure(s) Performed: * No procedures listed *  Anesthesia type:Epidural  Patient location: PACU  Post pain: Pain level controlled  Post assessment: Post-op Vital signs reviewed, Patient's Cardiovascular Status Stable, Respiratory Function Stable, Patent Airway and No signs of Nausea or vomiting  Post vital signs: Reviewed and stable  Last Vitals:  Filed Vitals:   04/20/15 0719  BP: 123/63  Pulse: 89  Temp: 36.6 C  Resp:     Level of consciousness: awake, alert  and patient cooperative  Complications: No apparent anesthesia complications

## 2015-04-21 NOTE — Progress Notes (Signed)
Post Partum Day 1  Subjective: no complaints, up ad lib, voiding and tolerating PO.  Objective: Blood pressure 128/84, pulse 69, temperature 98.2 F (36.8 C), temperature source Oral, resp. rate 18, height  (1.676 m), weight 231 lb (104.781 kg), last menstrual period 07/13/2014, SpO2 100 %, unknown if currently breastfeeding.  Physical Exam:  General: alert and no distress Lochia: appropriate Uterine Fundus: firm Incision: none DVT Evaluation: No evidence of DVT seen on physical exam. No cords or calf tenderness. No significant calf/ankle edema.   Recent Labs  04/19/15 1103 04/20/15 0529  HGB 10.8* 10.2*  HCT 31.7* 30.6*    Assessment/Plan: Contraception Nexplanon.   Bottle feeding.  Discharge home today.     LOS: 2 days   Hildred Laser 04/21/2015, 9:43 AM

## 2015-04-21 NOTE — Progress Notes (Signed)
Discharge instructions provided.  Pt and sig other verbalize understanding of all instructions and follow-up care.  Prescriptions given.  Pt discharged to home with infant at 1435 on 04/21/15 via wheelchair by volunteer. Reynold Bowen, RN 04/21/2015 2:40 PM

## 2015-04-21 NOTE — Clinical Social Work Maternal (Signed)
CLINICAL SOCIAL WORK MATERNAL/CHILD NOTE  Patient Details  Name: Kathryn Livingston MRN: [id number] Date of Birth: 04/19/2017  Date: 04/21/2015  Clinical Social Worker Initiating Note: Lynn Washington, LCSWDate/ Time Initiated: 04/21/15/1320   Child's Name: Kathryn Livingston   Legal Guardian: Mother   Need for Interpreter: None   Date of Referral: 04/20/15   Reason for Referral: Current Substance Use/Substance Use During Pregnancy    Referral Source: Physician   Address: [address], [place], [place] [address]  Phone number: [phone]   Household Members: Self, Minor Children, Significant Other   Natural Supports (not living in the home): Friends, Extended Family   Professional Supports:None   Employment:Homemaker   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 Mother stated she used in order to increase her appetite. Mother and father have been dating for approximately 1 year and plan to marry soon. Mother has 3 children. Parents state that all of basic needs are made and they have a strong support system. Mother stated she does not plan to continue use and was not interested in substance abuse resources.  CSW Plan/Description: No Further Intervention Required/No Barriers to Discharge   Lynn Washington, LCSW [phone] Washington, Lynetta D, LCSW 04/21/2015, 1:50 PM              

## 2015-04-21 NOTE — Progress Notes (Signed)
Prenatal records indicate that pt received TDaP vaccine on 01/16/15. Reynold Bowen, RN 04/21/2015 12:25 PM

## 2015-04-24 ENCOUNTER — Ambulatory Visit: Admission: RE | Admit: 2015-04-24 | Payer: Medicaid Other | Source: Ambulatory Visit

## 2015-06-02 ENCOUNTER — Encounter (HOSPITAL_COMMUNITY): Payer: Self-pay

## 2015-06-03 ENCOUNTER — Ambulatory Visit: Payer: Medicaid Other | Admitting: Obstetrics and Gynecology

## 2015-07-06 ENCOUNTER — Encounter (HOSPITAL_COMMUNITY): Payer: Self-pay | Admitting: Emergency Medicine

## 2015-07-06 ENCOUNTER — Emergency Department (HOSPITAL_COMMUNITY)
Admission: EM | Admit: 2015-07-06 | Discharge: 2015-07-06 | Disposition: A | Discharge status: Home / Self Care | Payer: Medicaid Other | Attending: Emergency Medicine | Admitting: Emergency Medicine

## 2015-07-06 ENCOUNTER — Emergency Department (HOSPITAL_COMMUNITY): Payer: Medicaid Other

## 2015-07-06 DIAGNOSIS — Z8742 Personal history of other diseases of the female genital tract: Secondary | ICD-10-CM | POA: Diagnosis not present

## 2015-07-06 DIAGNOSIS — S3992XA Unspecified injury of lower back, initial encounter: Secondary | ICD-10-CM | POA: Insufficient documentation

## 2015-07-06 DIAGNOSIS — Y9389 Activity, other specified: Secondary | ICD-10-CM | POA: Diagnosis not present

## 2015-07-06 DIAGNOSIS — S79911A Unspecified injury of right hip, initial encounter: Secondary | ICD-10-CM | POA: Diagnosis not present

## 2015-07-06 DIAGNOSIS — Y9289 Other specified places as the place of occurrence of the external cause: Secondary | ICD-10-CM | POA: Insufficient documentation

## 2015-07-06 DIAGNOSIS — M25561 Pain in right knee: Secondary | ICD-10-CM

## 2015-07-06 DIAGNOSIS — Z79899 Other long term (current) drug therapy: Secondary | ICD-10-CM | POA: Insufficient documentation

## 2015-07-06 DIAGNOSIS — T148 Other injury of unspecified body region: Secondary | ICD-10-CM | POA: Insufficient documentation

## 2015-07-06 DIAGNOSIS — W19XXXA Unspecified fall, initial encounter: Secondary | ICD-10-CM

## 2015-07-06 DIAGNOSIS — Y998 Other external cause status: Secondary | ICD-10-CM | POA: Insufficient documentation

## 2015-07-06 DIAGNOSIS — E669 Obesity, unspecified: Secondary | ICD-10-CM | POA: Insufficient documentation

## 2015-07-06 DIAGNOSIS — T148XXA Other injury of unspecified body region, initial encounter: Secondary | ICD-10-CM

## 2015-07-06 DIAGNOSIS — S4991XA Unspecified injury of right shoulder and upper arm, initial encounter: Secondary | ICD-10-CM | POA: Diagnosis not present

## 2015-07-06 DIAGNOSIS — W130XXA Fall from, out of or through balcony, initial encounter: Secondary | ICD-10-CM | POA: Diagnosis not present

## 2015-07-06 DIAGNOSIS — Z720 Tobacco use: Secondary | ICD-10-CM | POA: Insufficient documentation

## 2015-07-06 DIAGNOSIS — S199XXA Unspecified injury of neck, initial encounter: Secondary | ICD-10-CM | POA: Diagnosis present

## 2015-07-06 MED ORDER — NAPROXEN 500 MG PO TABS: 500.0000 mg | ORAL_TABLET | Freq: Two times a day (BID) | ORAL | Status: DC

## 2015-07-06 MED ORDER — METHOCARBAMOL 500 MG PO TABS
500.0000 mg | ORAL_TABLET | Freq: Once | ORAL | Status: AC
  Administered 2015-07-06: 500 mg via ORAL
  Filled 2015-07-06: qty 1

## 2015-07-06 MED ORDER — METHOCARBAMOL 500 MG PO TABS: 1000.0000 mg | ORAL_TABLET | Freq: Four times a day (QID) | ORAL | Status: DC

## 2015-07-06 MED ORDER — NAPROXEN 500 MG PO TABS
500.0000 mg | ORAL_TABLET | Freq: Once | ORAL | Status: AC
  Administered 2015-07-06: 500 mg via ORAL
  Filled 2015-07-06: qty 1

## 2015-07-06 NOTE — ED Provider Notes (Signed)
CSN: [id number]     Arrival date & time 07/06/15  1830 History   First MD Initiated Contact with Patient 07/06/15 1909     Chief Complaint  Patient presents with  . fall from collapsed balcony   . body pain      (Consider location/radiation/quality/duration/timing/severity/associated sxs/prior Treatment) HPI Comments: Patient presents with right-sided body pain after a fall late last night. Patient was on a balcony that collapsed. She did not hit her head or lose consciousness. She was assisted on scene by friends. She then walked home. She states that she had neck and shoulder pain last night. She had worse right arm pain, right knee pain and right hip pain upon waking up this morning. No treatments prior to arrival. The onset of this condition was acute. The course is constant. Aggravating factors: movement. Alleviating factors: none.    The history is provided by the patient.    Past Medical History  Diagnosis Date  . Headache   . Breast abscess   . Tobacco use in pregnancy   . Low maternal weight gain   . Obesity    History reviewed. No pertinent past surgical history. Family History  Problem Relation Age of Onset  . Stroke Father   . Cancer Paternal Aunt    Social History  Substance Use Topics  . Smoking status: Current Every Day Smoker -- 0.50 packs/day for 15 years    Types: Cigarettes  . Smokeless tobacco: Never Used  . Alcohol Use: No     Comment: 2-3 40s a day for several years   OB History    Gravida Para Term Preterm AB TAB SAB Ectopic Multiple Living   0 0 0 0 0  4     Review of Systems  Constitutional: Negative for fever and activity change.  HENT: Negative for rhinorrhea and sore throat.   Eyes: Negative for redness.  Respiratory: Negative for cough.   Cardiovascular: Negative for chest pain.  Gastrointestinal: Negative for nausea, vomiting, abdominal pain and diarrhea.  Genitourinary: Negative for dysuria.  Musculoskeletal: Positive for  myalgias, back pain, arthralgias and neck pain. Negative for joint swelling.  Skin: Negative for rash and wound.  Neurological: Negative for weakness, numbness and headaches.      Allergies  Review of patient's allergies indicates no known allergies.  Home Medications   Prior to Admission medications   Medication Sig Start Date End Date Taking? Authorizing Provider  Prenat-FeFum-FePo-FA-Omega 3 (CONCEPT DHA) 53.5-38-1 MG CAPS Take 1 tablet by mouth daily.  02/12/15  Yes Historical Provider, MD  ibuprofen (ADVIL,MOTRIN) 800 MG tablet Take 1 tablet (800 mg total) by mouth every 6 (six) hours. Patient not taking: Reported on 07/06/2015 04/20/15   Hildred Laser, MD   BP 120/76 mmHg  Pulse 74  Temp(Src) 98.3 F (36.8 C) (Oral)  Resp 18  SpO2 97%  LMP 06/06/2015 Physical Exam  Constitutional: She appears well-developed and well-nourished.  HENT:  Head: Normocephalic and atraumatic.  Eyes: Conjunctivae are normal. Right eye exhibits no discharge. Left eye exhibits no discharge.  Neck: Normal range of motion. Neck supple.  Cardiovascular: Normal rate, regular rhythm and normal heart sounds.   Pulmonary/Chest: Effort normal and breath sounds normal. She exhibits no tenderness.  No external signs of trauma.  Abdominal: Soft. There is no tenderness. There is no rebound and no guarding.  No external signs of trauma.  Musculoskeletal:       Right shoulder: She exhibits tenderness and bony tenderness.  Left shoulder: Normal.       Right elbow: She exhibits no swelling and no effusion. No tenderness found.       Left elbow: Normal.       Right wrist: Normal.       Left wrist: Normal.       Right hip: She exhibits tenderness. She exhibits normal range of motion and normal strength.       Left hip: She exhibits normal range of motion, normal strength and no tenderness.       Right knee: She exhibits normal range of motion, no swelling and no effusion. Tenderness found.       Left knee:  Normal.       Right ankle: Normal. She exhibits normal range of motion.       Left ankle: Normal.       Cervical back: She exhibits tenderness and bony tenderness.       Thoracic back: She exhibits no tenderness and no bony tenderness.       Lumbar back: She exhibits no tenderness and no bony tenderness.       Right upper arm: She exhibits tenderness.       Right forearm: Normal.       Right hand: Normal.       Right foot: Normal.       Left foot: Normal.  Patient was poorly compliant with ranging of extremities.  Neurological: She is alert.  Skin: Skin is warm and dry.  Psychiatric: She has a normal mood and affect.  Nursing note and vitals reviewed.   ED Course  Procedures (including critical care time) Labs Review Labs Reviewed - No data to display  Imaging Review Dg Cervical Spine Complete  07/06/2015   CLINICAL DATA:  Patient status post fall from deck. Right-sided neck pain. Initial encounter.  EXAM: CERVICAL SPINE  4+ VIEWS  COMPARISON:  None.  FINDINGS: Straightening of the normal cervical lordosis. Visualization through C6 on lateral view. Preservation of the vertebral body and intervertebral disc space heights. Prevertebral soft tissues are unremarkable. Lung apices are unremarkable. Lateral masses articulate appropriately with the dens.  IMPRESSION: No acute osseous abnormality.  Reversal of the normal cervical lordosis. Recommend clinical correlation to exclude ligamentous injury.   Electronically Signed   By: Annia Belt M.D.   On: 07/06/2015 20:54   Dg Shoulder Right  07/06/2015   CLINICAL DATA:  Right shoulder pain after a fall today. The deck on which she was standing collapsed.  EXAM: RIGHT SHOULDER - 2+ VIEW  COMPARISON:  None.  FINDINGS: There is no evidence of fracture or dislocation. There is no evidence of arthropathy or other focal bone abnormality. The lucency in the humeral head is a normal finding. Soft tissues are unremarkable.  IMPRESSION: Negative.    Electronically Signed   By: Francene Boyers M.D.   On: 07/06/2015 20:56   Dg Knee Complete 4 Views Right  07/06/2015   CLINICAL DATA:  29 year old female was standing on deck which collapsed. Right side pain. Initial encounter.  EXAM: RIGHT KNEE - COMPLETE 4+ VIEW  COMPARISON:  None.  FINDINGS: Bone mineralization is within normal limits. No definite joint effusion. Joint spaces and alignment within normal limits. Patella intact. No acute fracture or dislocation identified about the right knee.  IMPRESSION: No acute fracture or dislocation identified about the right knee.   Electronically Signed   By: Odessa Fleming M.D.   On: 07/06/2015 20:56   I have personally  reviewed and evaluated these images and lab results as part of my medical decision-making.   EKG Interpretation None       7:52 PM Patient seen and examined. Work-up initiated. Medications ordered.   Vital signs reviewed and are as follows: BP 120/76 mmHg  Pulse 74  Temp(Src) 98.3 F (36.8 C) (Oral)  Resp 18  SpO2 97%  LMP 06/06/2015  10:20 PM patient updated on imaging results. She states that she is feeling better now after naproxen and Robaxin and is able to move her arms and legs. She has been up and ambulated to the bathroom without assistance or difficulty.  Patient counseled on typical course of muscle stiffness and soreness. Discussed s/s that should cause them to return. Patient instructed on NSAID use.  Instructed that prescribed medicine can cause drowsiness and they should not work, drink alcohol, drive while taking this medicine. Told to return if symptoms do not improve in several days. Patient verbalized understanding and agreed with the plan. D/c to home.      MDM   Final diagnoses:  Muscle strain  Fall, initial encounter   Patient with muscle pain after fall. Imaging performed of the most tender areas without evidence of fractures. Normal neurological exam. Symptoms improved in the ED with treatment. No concern for  intracranial injury, spinal injury, intra-abdominal or thoracic injury. Extremities are neurovascularly intact.    Renne Crigler, PA-C 07/06/15 2222  Vanetta Mulders, MD 07/06/15 [phone]

## 2015-07-06 NOTE — ED Notes (Signed)
Pt states that she was at a friend's house last night when people where out on 2nd-3rd story porch when it collapsed. Pt c/o entire right side body pain from neck down. Pt ambulated in from lobby with steady gait.

## 2015-07-06 NOTE — ED Notes (Signed)
Pt ambulated independently to the bathroom and back to her room w/ a steady gait.

## 2015-07-06 NOTE — Discharge Instructions (Signed)
Please read and follow all provided instructions.  Your diagnoses today include:  1. Muscle strain   2. Right knee pain   3. Fall, initial encounter    Tests performed today include:  Vital signs. See below for your results today.   X-ray of affected areas - no broken bones  Medications prescribed:    Robaxin (methocarbamol) - muscle relaxer medication  DO NOT drive or perform any activities that require you to be awake and alert because this medicine can make you drowsy.    Naproxen - anti-inflammatory pain medication  Do not exceed  naproxen every 12 hours, take with food  You have been prescribed an anti-inflammatory medication or NSAID. Take with food. Take smallest effective dose for the shortest duration needed for your pain. Stop taking if you experience stomach pain or vomiting.   Take any prescribed medications only as directed.  Home care instructions:  Follow any educational materials contained in this packet. The worst pain and soreness will be 24-48 hours after the accident. Your symptoms should resolve steadily over several days at this time. Use warmth on affected areas as needed.   Follow-up instructions: Please follow-up with your primary care provider in 1 week for further evaluation of your symptoms if they are not completely improved.   Return instructions:   Please return to the Emergency Department if you experience worsening symptoms.   Please return if you experience increasing pain, vomiting, vision or hearing changes, confusion, numbness or tingling in your arms or legs, or if you feel it is necessary for any reason.   Please return if you have any other emergent concerns.  Additional Information:  Your vital signs today were: BP 127/78 mmHg   Pulse 66   Temp(Src) 98.2 F (36.8 C) (Oral)   Resp 16   SpO2 100%   LMP 06/06/2015 If your blood pressure (BP) was elevated above 135/85 this visit, please have this repeated by your doctor within  one month. --------------

## 2015-11-19 ENCOUNTER — Encounter (HOSPITAL_COMMUNITY): Payer: Self-pay

## 2015-11-19 ENCOUNTER — Emergency Department (HOSPITAL_COMMUNITY)
Admission: EM | Admit: 2015-11-19 | Discharge: 2015-11-19 | Disposition: A | Discharge status: Home / Self Care | Payer: Medicaid Other | Attending: Emergency Medicine | Admitting: Emergency Medicine

## 2015-11-19 DIAGNOSIS — N12 Tubulo-interstitial nephritis, not specified as acute or chronic: Secondary | ICD-10-CM | POA: Diagnosis not present

## 2015-11-19 DIAGNOSIS — Z3202 Encounter for pregnancy test, result negative: Secondary | ICD-10-CM | POA: Insufficient documentation

## 2015-11-19 DIAGNOSIS — Z8742 Personal history of other diseases of the female genital tract: Secondary | ICD-10-CM | POA: Diagnosis not present

## 2015-11-19 DIAGNOSIS — Z79899 Other long term (current) drug therapy: Secondary | ICD-10-CM | POA: Insufficient documentation

## 2015-11-19 DIAGNOSIS — R109 Unspecified abdominal pain: Secondary | ICD-10-CM | POA: Diagnosis present

## 2015-11-19 DIAGNOSIS — E669 Obesity, unspecified: Secondary | ICD-10-CM | POA: Diagnosis not present

## 2015-11-19 DIAGNOSIS — F17210 Nicotine dependence, cigarettes, uncomplicated: Secondary | ICD-10-CM | POA: Insufficient documentation

## 2015-11-19 LAB — CBC
HEMATOCRIT: 35 % — AB (ref 36.0–46.0)
HEMOGLOBIN: 11.8 g/dL — AB (ref 12.0–15.0)
MCH: 31.1 pg (ref 26.0–34.0)
MCHC: 33.7 g/dL (ref 30.0–36.0)
MCV: 92.1 fL (ref 78.0–100.0)
Platelets: 297 10*3/uL (ref 150–400)
RBC: 3.8 MIL/uL — AB (ref 3.87–5.11)
RDW: 13.3 % (ref 11.5–15.5)
WBC: 18.6 10*3/uL — AB (ref 4.0–10.5)

## 2015-11-19 LAB — COMPREHENSIVE METABOLIC PANEL
ALT: 41 U/L (ref 14–54)
ANION GAP: 13 (ref 5–15)
AST: 47 U/L — ABNORMAL HIGH (ref 15–41)
Albumin: 4 g/dL (ref 3.5–5.0)
Alkaline Phosphatase: 96 U/L (ref 38–126)
BUN: 8 mg/dL (ref 6–20)
CHLORIDE: 104 mmol/L (ref 101–111)
CO2: 24 mmol/L (ref 22–32)
Calcium: 9.4 mg/dL (ref 8.9–10.3)
Creatinine, Ser: 0.74 mg/dL (ref 0.44–1.00)
Glucose, Bld: 98 mg/dL (ref 65–99)
POTASSIUM: 4.2 mmol/L (ref 3.5–5.1)
SODIUM: 141 mmol/L (ref 135–145)
Total Bilirubin: 1.5 mg/dL — ABNORMAL HIGH (ref 0.3–1.2)
Total Protein: 7.2 g/dL (ref 6.5–8.1)

## 2015-11-19 LAB — URINALYSIS, ROUTINE W REFLEX MICROSCOPIC
Bilirubin Urine: NEGATIVE
GLUCOSE, UA: NEGATIVE mg/dL
Ketones, ur: NEGATIVE mg/dL
Nitrite: POSITIVE — AB
PROTEIN: 100 mg/dL — AB
SPECIFIC GRAVITY, URINE: 1.01 (ref 1.005–1.030)
pH: 6 (ref 5.0–8.0)

## 2015-11-19 LAB — LIPASE, BLOOD: LIPASE: 25 U/L (ref 11–51)

## 2015-11-19 LAB — URINE MICROSCOPIC-ADD ON

## 2015-11-19 LAB — POC URINE PREG, ED: PREG TEST UR: NEGATIVE

## 2015-11-19 MED ORDER — ONDANSETRON 4 MG PO TBDP: 4.0000 mg | ORAL_TABLET | Freq: Three times a day (TID) | ORAL | Status: DC | PRN

## 2015-11-19 MED ORDER — SODIUM CHLORIDE 0.9 % IV BOLUS (SEPSIS)
500.0000 mL | Freq: Once | INTRAVENOUS | Status: AC
  Administered 2015-11-19: 500 mL via INTRAVENOUS

## 2015-11-19 MED ORDER — DEXTROSE 5 % IV SOLN
1.0000 g | Freq: Once | INTRAVENOUS | Status: AC
  Administered 2015-11-19: 1 g via INTRAVENOUS
  Filled 2015-11-19: qty 10

## 2015-11-19 MED ORDER — ACETAMINOPHEN 325 MG PO TABS
650.0000 mg | ORAL_TABLET | Freq: Once | ORAL | Status: AC
  Administered 2015-11-19: 650 mg via ORAL
  Filled 2015-11-19: qty 2

## 2015-11-19 MED ORDER — SODIUM CHLORIDE 0.9 % IV BOLUS (SEPSIS)
1000.0000 mL | Freq: Once | INTRAVENOUS | Status: AC
  Administered 2015-11-19: 1000 mL via INTRAVENOUS

## 2015-11-19 MED ORDER — ONDANSETRON HCL 4 MG/2ML IJ SOLN
4.0000 mg | Freq: Once | INTRAMUSCULAR | Status: AC
  Administered 2015-11-19: 4 mg via INTRAVENOUS
  Filled 2015-11-19: qty 2

## 2015-11-19 MED ORDER — MORPHINE SULFATE (PF) 4 MG/ML IV SOLN
4.0000 mg | Freq: Once | INTRAVENOUS | Status: AC
  Administered 2015-11-19: 4 mg via INTRAVENOUS
  Filled 2015-11-19: qty 1

## 2015-11-19 MED ORDER — NAPROXEN 250 MG PO TABS: 250.0000 mg | ORAL_TABLET | Freq: Two times a day (BID) | ORAL | Status: DC

## 2015-11-19 MED ORDER — CEPHALEXIN 500 MG PO CAPS: 500.0000 mg | ORAL_CAPSULE | Freq: Four times a day (QID) | ORAL | Status: DC

## 2015-11-19 MED ORDER — KETOROLAC TROMETHAMINE 30 MG/ML IJ SOLN
15.0000 mg | Freq: Once | INTRAMUSCULAR | Status: AC
  Administered 2015-11-19: 15 mg via INTRAVENOUS
  Filled 2015-11-19: qty 1

## 2015-11-19 NOTE — ED Provider Notes (Signed)
CSN: [id number]     Arrival date & time 11/19/15  0809 History   First MD Initiated Contact with Patient 11/19/15 1043     Chief Complaint  Patient presents with  . Abdominal Pain    Hematuria   Kathryn Livingston is a 30 y.o. female who presents to the ED complaining of left mid abdominal pain since yesterday with associated hematuria and urinary urgency and frequency. She currently complains of 10 out of 10 left mid abdominal pain. Pain has been constant. No back pain. No recent history of UTIs. She denies any vaginal bleeding or vaginal discharge. No vomiting or diarrhea. No treatments prior to arrival today. No previous abdominal surgeries. Last bowel movement was yesterday and was normal. She has been passing gas. She reports chills but no fevers. LMP 10/20/15.  Patient denies fevers, previous abdominal surgeries, vaginal bleeding, vaginal discharge, lower abdominal pain, back pain, vomiting, diarrhea, cough, chest pain, shortness of breath, or rashes.   Patient is a 30 y.o. female presenting with abdominal pain. The history is provided by the patient. No language interpreter was used.  Abdominal Pain Associated symptoms: chills, hematuria and nausea   Associated symptoms: no chest pain, no cough, no diarrhea, no dysuria, no fever, no shortness of breath, no sore throat, no vaginal bleeding and no vomiting     Past Medical History  Diagnosis Date  . Headache   . Breast abscess   . Tobacco use in pregnancy   . Low maternal weight gain   . Obesity    History reviewed. No pertinent past surgical history. Family History  Problem Relation Age of Onset  . Stroke Father   . Cancer Paternal Aunt    Social History  Substance Use Topics  . Smoking status: Current Every Day Smoker -- 0.50 packs/day for 15 years    Types: Cigarettes  . Smokeless tobacco: Never Used  . Alcohol Use: No     Comment: 2-3 40s a day for several years   OB History    Gravida Para Term Preterm AB TAB SAB Ectopic  Multiple Living   0 0 0 0 0  4     Review of Systems  Constitutional: Positive for chills. Negative for fever.  HENT: Negative for congestion and sore throat.   Eyes: Negative for visual disturbance.  Respiratory: Negative for cough, shortness of breath and wheezing.   Cardiovascular: Negative for chest pain and palpitations.  Gastrointestinal: Positive for nausea and abdominal pain. Negative for vomiting, diarrhea and blood in stool.  Genitourinary: Positive for urgency, frequency and hematuria. Negative for dysuria, flank pain, decreased urine volume, vaginal bleeding, difficulty urinating, vaginal pain and menstrual problem.  Musculoskeletal: Negative for back pain and neck pain.  Skin: Negative for rash.  Neurological: Negative for headaches.      Allergies  Review of patient's allergies indicates no known allergies.  Home Medications   Prior to Admission medications   Medication Sig Start Date End Date Taking? Authorizing Provider  cephALEXin (KEFLEX) 500 MG capsule Take 1 capsule (500 mg total) by mouth 4 (four) times daily. 11/19/15   Everlene Farrier, PA-C  methocarbamol (ROBAXIN) 500 MG tablet Take 2 tablets (1,000 mg total) by mouth 4 (four) times daily. 07/06/15   Renne Crigler, PA-C  naproxen (NAPROSYN) 250 MG tablet Take 1 tablet (250 mg total) by mouth 2 (two) times daily with a meal. 11/19/15   Everlene Farrier, PA-C  ondansetron (ZOFRAN ODT) 4 MG disintegrating tablet Take  1 tablet (4 mg total) by mouth every 8 (eight) hours as needed for nausea or vomiting. 11/19/15   Everlene Farrier, PA-C  Prenat-FeFum-FePo-FA-Omega 3 (CONCEPT DHA) 53.5-38-1 MG CAPS Take 1 tablet by mouth daily.  02/12/15   Historical Provider, MD   BP 135/84 mmHg  Pulse 108  Temp(Src) 101.6 F (38.7 C) (Oral)  Resp 18  SpO2 97%  LMP 10/20/2015 Physical Exam  Constitutional: She appears well-developed and well-nourished. No distress.  Nontoxic appearing.  HENT:  Head: Normocephalic and  atraumatic.  Mouth/Throat: Oropharynx is clear and moist.  Eyes: Conjunctivae are normal. Pupils are equal, round, and reactive to light. Right eye exhibits no discharge. Left eye exhibits no discharge.  Neck: Normal range of motion. Neck supple.  Cardiovascular: Regular rhythm, normal heart sounds and intact distal pulses.  Exam reveals no gallop and no friction rub.   No murmur heard. Heart rate is 108.  Pulmonary/Chest: Effort normal and breath sounds normal. No respiratory distress. She has no wheezes. She has no rales.  Abdominal: Soft. Bowel sounds are normal. She exhibits no distension and no mass. There is tenderness. There is no rebound and no guarding.  Abdomen is soft. Bowel sounds are present. Patient has moderate left sided mid abdominal tenderness to palpation. No lower abdominal tenderness. No CVA or flank tenderness. No psoas or obturator sign.   Musculoskeletal: She exhibits no edema.  Lymphadenopathy:    She has no cervical adenopathy.  Neurological: She is alert. Coordination normal.  Skin: Skin is warm and dry. No rash noted. She is not diaphoretic. No erythema. No pallor.  Psychiatric: She has a normal mood and affect. Her behavior is normal.  Nursing note and vitals reviewed.   ED Course  Procedures (including critical care time) Labs Review Labs Reviewed  COMPREHENSIVE METABOLIC PANEL - Abnormal; Notable for the following:    AST 47 (*)    Total Bilirubin 1.5 (*)    All other components within normal limits  CBC - Abnormal; Notable for the following:    WBC 18.6 (*)    RBC 3.80 (*)    Hemoglobin 11.8 (*)    HCT 35.0 (*)    All other components within normal limits  URINALYSIS, ROUTINE W REFLEX MICROSCOPIC (NOT AT [facility]) - Abnormal; Notable for the following:    APPearance CLOUDY (*)    Hgb urine dipstick LARGE (*)    Protein, ur 100 (*)    Nitrite POSITIVE (*)    Leukocytes, UA LARGE (*)    All other components within normal limits  URINE MICROSCOPIC-ADD  ON - Abnormal; Notable for the following:    Squamous Epithelial / LPF 6-30 (*)    Bacteria, UA MANY (*)    All other components within normal limits  URINE CULTURE  LIPASE, BLOOD  POC URINE PREG, ED    Imaging Review No results found. I have personally reviewed and evaluated these lab results as part of my medical decision-making.   EKG Interpretation None      Filed Vitals:   11/19/15 1345 11/19/15 1415 11/19/15 1430 11/19/15 1445  BP: 126/76 129/98 123/95 135/84  Pulse: 115 109 120 108  Temp:      TempSrc:      Resp:      SpO2: 95% 96% 98% 97%     MDM   Meds given in ED:  Medications  cefTRIAXone (ROCEPHIN) 1 g in dextrose 5 % 50 mL IVPB (0 g Intravenous Stopped 11/19/15 1222)  sodium chloride  0.9 % bolus 1,000 mL (0 mLs Intravenous Stopped 11/19/15 1300)  ondansetron (ZOFRAN) injection 4 mg (4 mg Intravenous Given 11/19/15 1152)  ketorolac (TORADOL) 30 MG/ML injection 15 mg (15 mg Intravenous Given 11/19/15 1152)  sodium chloride 0.9 % bolus 500 mL (0 mLs Intravenous Stopped 11/19/15 1607)  acetaminophen (TYLENOL) tablet 650 mg (650 mg Oral Given 11/19/15 1516)  ondansetron (ZOFRAN) injection 4 mg (4 mg Intravenous Given 11/19/15 1516)  morphine 4 MG/ML injection 4 mg (4 mg Intravenous Given 11/19/15 1516)  sodium chloride 0.9 % bolus 1,000 mL (1,000 mLs Intravenous New Bag/Given 11/19/15 1516)    New Prescriptions   CEPHALEXIN (KEFLEX) 500 MG CAPSULE    Take 1 capsule (500 mg total) by mouth 4 (four) times daily.   NAPROXEN (NAPROSYN) 250 MG TABLET    Take 1 tablet (250 mg total) by mouth 2 (two) times daily with a meal.   ONDANSETRON (ZOFRAN ODT) 4 MG DISINTEGRATING TABLET    Take 1 tablet (4 mg total) by mouth every 8 (eight) hours as needed for nausea or vomiting.    Final diagnoses:  Pyelonephritis   This  is a 30 y.o. female who presents to the ED complaining of left mid abdominal pain since yesterday with associated hematuria and urinary urgency and frequency. She  currently complains of 10 out of 10 left mid abdominal pain. Pain has been constant. No back pain. No recent history of UTIs. She denies any vaginal bleeding or vaginal discharge. No vomiting or diarrhea. No treatments prior to arrival today. No previous abdominal surgeries.  On exam patient is afebrile nontoxic appearing. She has moderate left-sided mid abdominal tenderness to palpation. No CVA or flank tenderness. No peritoneal signs. She is a negative urine pregnancy test. Lipase is within normal limits. CMP is unremarkable. CBC reveals a leukocytosis with a white count of 18,000. Urinalysis indicates nitrite positive urine with large leukocytes and many bacteria. Urine sent for culture. Patient has preserved kidney function. Patient provided with further IV fluids and a gram of Rocephin. She reports feeling lightheaded with position change. Will reevaluate. I have low suspicion for kidney stone due to patient's history and presentation. She has had constant pain that does not fluctuate in intensity.   At final reevaluation patient reports feeling better and does not feel lightheaded upon standing. She is ambulated to the bathroom without difficulty. She has been tolerating by mouth liquids and food without difficulty. Will discharge with prescriptions for Keflex, naproxen and Zofran. I discussed strict and specific return precautions related to pyelonephritis. I advised the patient to follow-up with their primary care provider this week. I advised the patient to return to the emergency department with new or worsening symptoms or new concerns. The patient verbalized understanding and agreement with plan.    This patient was discussed with Dr. Dalene Seltzer who agrees with assessment and plan.     Everlene Farrier, PA-C 11/19/15 1623  Alvira Monday, MD 11/20/15 1246

## 2015-11-19 NOTE — ED Notes (Signed)
Pt. Developed lt. Lower abdominal pain yesterday with hematuria, Denies any vaginal bleeding or discharge Pt also has developed chills and then feels hots.

## 2015-11-19 NOTE — Discharge Instructions (Signed)

## 2015-11-19 NOTE — ED Notes (Signed)
Provider aware of HR at discharge.

## 2015-11-19 NOTE — ED Notes (Signed)
Pt given ice water.

## 2015-11-21 LAB — URINE CULTURE: Culture: >=100000

## 2015-11-22 ENCOUNTER — Telehealth (HOSPITAL_COMMUNITY): Payer: Self-pay

## 2015-11-22 NOTE — Telephone Encounter (Signed)
Post ED Visit - Positive Culture Follow-up  Culture report reviewed by antimicrobial stewardship pharmacist:   Enzo Bi, Pharm.D.  Celedonio Miyamoto, Pharm.D., BCPS  Garvin Fila, Pharm.D.  Georgina Pillion, Pharm.D., BCPS  [place], [address].D., BCPS, AAHIVP  Estella Husk, Pharm.D., BCPS, AAHIVP  Tennis Must, Pharm.D.  Sherle Poe, [address].D. Carmon Sails Rumbarger, Pharm.D.  Positive urine culture, >/= 100,000 colonies -> E Coli Treated with Cephalexin, organism sensitive to the same and no further patient follow-up is required at this time.  Arvid Right 11/22/2015, 10:26 PM

## 2016-04-11 ENCOUNTER — Encounter (HOSPITAL_COMMUNITY): Payer: Self-pay | Admitting: *Deleted

## 2016-04-11 ENCOUNTER — Encounter (HOSPITAL_COMMUNITY): Payer: Self-pay | Admitting: Emergency Medicine

## 2016-04-11 ENCOUNTER — Inpatient Hospital Stay (HOSPITAL_COMMUNITY)
Admission: AD | Admit: 2016-04-11 | Discharge: 2016-04-13 | DRG: 885 | Disposition: A | Discharge status: Home / Self Care | Payer: Medicaid Other | Source: Intra-hospital | Attending: Psychiatry | Admitting: Psychiatry

## 2016-04-11 ENCOUNTER — Emergency Department (HOSPITAL_COMMUNITY)
Admission: EM | Admit: 2016-04-11 | Discharge: 2016-04-11 | Disposition: A | Discharge status: Other Acute Inpatient Hospital | Payer: Medicaid Other | Attending: Emergency Medicine | Admitting: Emergency Medicine

## 2016-04-11 DIAGNOSIS — F411 Generalized anxiety disorder: Secondary | ICD-10-CM | POA: Diagnosis present

## 2016-04-11 DIAGNOSIS — F410 Panic disorder [episodic paroxysmal anxiety] without agoraphobia: Secondary | ICD-10-CM | POA: Diagnosis present

## 2016-04-11 DIAGNOSIS — F1290 Cannabis use, unspecified, uncomplicated: Secondary | ICD-10-CM | POA: Insufficient documentation

## 2016-04-11 DIAGNOSIS — F323 Major depressive disorder, single episode, severe with psychotic features: Principal | ICD-10-CM | POA: Diagnosis present

## 2016-04-11 DIAGNOSIS — G4700 Insomnia, unspecified: Secondary | ICD-10-CM | POA: Diagnosis present

## 2016-04-11 DIAGNOSIS — F17210 Nicotine dependence, cigarettes, uncomplicated: Secondary | ICD-10-CM | POA: Diagnosis present

## 2016-04-11 DIAGNOSIS — F1010 Alcohol abuse, uncomplicated: Secondary | ICD-10-CM | POA: Diagnosis present

## 2016-04-11 DIAGNOSIS — Z823 Family history of stroke: Secondary | ICD-10-CM

## 2016-04-11 DIAGNOSIS — Z5181 Encounter for therapeutic drug level monitoring: Secondary | ICD-10-CM | POA: Insufficient documentation

## 2016-04-11 DIAGNOSIS — F419 Anxiety disorder, unspecified: Secondary | ICD-10-CM | POA: Insufficient documentation

## 2016-04-11 HISTORY — DX: Alcohol abuse, uncomplicated: F10.10

## 2016-04-11 LAB — CBC
HEMATOCRIT: 37.1 % (ref 36.0–46.0)
HEMOGLOBIN: 12.4 g/dL (ref 12.0–15.0)
MCH: 30.2 pg (ref 26.0–34.0)
MCHC: 33.4 g/dL (ref 30.0–36.0)
MCV: 90.5 fL (ref 78.0–100.0)
Platelets: 347 10*3/uL (ref 150–400)
RBC: 4.1 MIL/uL (ref 3.87–5.11)
RDW: 14.3 % (ref 11.5–15.5)
WBC: 8.7 10*3/uL (ref 4.0–10.5)

## 2016-04-11 LAB — COMPREHENSIVE METABOLIC PANEL
ALT: 18 U/L (ref 14–54)
AST: 23 U/L (ref 15–41)
Albumin: 5.2 g/dL — ABNORMAL HIGH (ref 3.5–5.0)
Alkaline Phosphatase: 84 U/L (ref 38–126)
Anion gap: 14 (ref 5–15)
BUN: 12 mg/dL (ref 6–20)
CHLORIDE: 107 mmol/L (ref 101–111)
CO2: 21 mmol/L — AB (ref 22–32)
CREATININE: 0.64 mg/dL (ref 0.44–1.00)
Calcium: 9.1 mg/dL (ref 8.9–10.3)
GFR calc non Af Amer: >60 mL/min (ref >60)
Glucose, Bld: 56 mg/dL — ABNORMAL LOW (ref 65–99)
POTASSIUM: 3.6 mmol/L (ref 3.5–5.1)
SODIUM: 142 mmol/L (ref 135–145)
Total Bilirubin: 1.1 mg/dL (ref 0.3–1.2)
Total Protein: 8.4 g/dL — ABNORMAL HIGH (ref 6.5–8.1)

## 2016-04-11 LAB — RAPID URINE DRUG SCREEN, HOSP PERFORMED
AMPHETAMINES: NOT DETECTED
BARBITURATES: NOT DETECTED
BENZODIAZEPINES: NOT DETECTED
COCAINE: NOT DETECTED
Opiates: NOT DETECTED
TETRAHYDROCANNABINOL: POSITIVE — AB

## 2016-04-11 LAB — I-STAT BETA HCG BLOOD, ED (MC, WL, AP ONLY)

## 2016-04-11 LAB — ETHANOL: Alcohol, Ethyl (B): 68 mg/dL — ABNORMAL HIGH (ref <5)

## 2016-04-11 MED ORDER — NICOTINE POLACRILEX 2 MG MT GUM
CHEWING_GUM | OROMUCOSAL | Status: AC
  Administered 2016-04-11: 17:00:00
  Filled 2016-04-11: qty 1

## 2016-04-11 MED ORDER — LORAZEPAM 1 MG PO TABS: 1.0000 mg | ORAL_TABLET | Freq: Three times a day (TID) | ORAL | Status: DC | PRN

## 2016-04-11 MED ORDER — HYDROXYZINE HCL 25 MG PO TABS
25.0000 mg | ORAL_TABLET | Freq: Four times a day (QID) | ORAL | Status: DC | PRN
  Administered 2016-04-11 – 2016-04-12 (×2): 25 mg via ORAL
  Filled 2016-04-11 (×2): qty 1

## 2016-04-11 MED ORDER — ZOLPIDEM TARTRATE 5 MG PO TABS: 5.0000 mg | ORAL_TABLET | Freq: Every evening | ORAL | Status: DC | PRN

## 2016-04-11 MED ORDER — NICOTINE 21 MG/24HR TD PT24
21.0000 mg | MEDICATED_PATCH | Freq: Every day | TRANSDERMAL | Status: DC
  Filled 2016-04-11 (×3): qty 1

## 2016-04-11 MED ORDER — ACETAMINOPHEN 325 MG PO TABS: 650.0000 mg | ORAL_TABLET | ORAL | Status: DC | PRN

## 2016-04-11 MED ORDER — ONDANSETRON HCL 4 MG PO TABS: 4.0000 mg | ORAL_TABLET | Freq: Three times a day (TID) | ORAL | Status: DC | PRN

## 2016-04-11 MED ORDER — IBUPROFEN 600 MG PO TABS
600.0000 mg | ORAL_TABLET | Freq: Three times a day (TID) | ORAL | Status: DC | PRN
Start: 2016-04-11 — End: 2016-04-13

## 2016-04-11 MED ORDER — DIPHENHYDRAMINE HCL 25 MG PO CAPS
ORAL_CAPSULE | ORAL | Status: AC
  Filled 2016-04-11: qty 1

## 2016-04-11 MED ORDER — ALUM & MAG HYDROXIDE-SIMETH 200-200-20 MG/5ML PO SUSP: 30.0000 mL | ORAL | Status: DC | PRN

## 2016-04-11 MED ORDER — NICOTINE 21 MG/24HR TD PT24
21.0000 mg | MEDICATED_PATCH | Freq: Every day | TRANSDERMAL | Status: DC
  Administered 2016-04-11: 21 mg via TRANSDERMAL
  Filled 2016-04-11: qty 1

## 2016-04-11 MED ORDER — TRAZODONE HCL 50 MG PO TABS: 50.0000 mg | ORAL_TABLET | Freq: Every evening | ORAL | Status: DC | PRN

## 2016-04-11 MED ORDER — MAGNESIUM HYDROXIDE 400 MG/5ML PO SUSP: 30.0000 mL | Freq: Every day | ORAL | Status: DC | PRN

## 2016-04-11 MED ORDER — HYDROXYZINE HCL 25 MG PO TABS: 25.0000 mg | ORAL_TABLET | Freq: Four times a day (QID) | ORAL | Status: DC | PRN

## 2016-04-11 MED ORDER — ACETAMINOPHEN 325 MG PO TABS: 650.0000 mg | ORAL_TABLET | Freq: Four times a day (QID) | ORAL | Status: DC | PRN

## 2016-04-11 MED ORDER — IBUPROFEN 200 MG PO TABS: 600.0000 mg | ORAL_TABLET | Freq: Three times a day (TID) | ORAL | Status: DC | PRN

## 2016-04-11 MED ORDER — DIPHENHYDRAMINE HCL 25 MG PO CAPS
25.0000 mg | ORAL_CAPSULE | Freq: Four times a day (QID) | ORAL | Status: DC | PRN
  Administered 2016-04-11 – 2016-04-12 (×2): 25 mg via ORAL
  Filled 2016-04-11: qty 1

## 2016-04-11 NOTE — Clinical Social Work Note (Signed)
CSW facilitated consent to treat document for patient to be admitted [facility].  [facility] and is going into 406 bed 2.  Kathryn Buba.Gunda Maqueda, LCSW [facility] Clinical Social Worker - Weekend Coverage cell #: [id number]

## 2016-04-11 NOTE — BH Assessment (Addendum)
Tele Assessment Note   Kathryn Livingston is an 30 y.o. female  who presents voluntarily accompanied by EMS reporting waking up and feeling like she could not breathe. She describes recent symptoms of anxiety, depression, visual hallucinations and hearing voices telling her to harm others for the past two days. When asked about thoughts to harm others, pt states, "I don't want to talk about that. I know I would not do anything, becasue I have my kids and I don't want to go to jail". Pt denies previous mental health history. Pt reports no medication. Pt denies current suicidal ideation or past attempts.  Pt acknowledges symptoms including: relationship problems with her significant other (wants to leave), problems sleeping, isolation from others. PT denies history of violence.  Pt lives with her 4 kids and significant other, and supports include her family (aunt, etc.). Pt denies history of abuse and trauma.  Pt reports there is a family history of unspecified mental health problems in an aunt. Pt's work history includes currently working at [employer] . Pt has fair insight and judgment. Pt's memory is normal . Pt denies legal history. ? Pt denies OP or IP history.  Pt denies having a problem with alcohol/ substance abuse. ? MSE: Pt is disheveled, alert, oriented x4 with normal speech and restless motor behavior. Eye contact is fair. Pt's mood is depressed and affect is depressed and anxious. Affect is congruent with mood. Thought process is coherent and relevant. There is no indication Pt is currently responding to internal stimuli or experiencing delusional thought content. Pt was cooperative throughout assessment. Pt is currently unable to contract for safety outside the hospital and wants inpatient psychiatric treatment.  Kathryn Means, DNP recommends IP treatment.    Diagnosis: MDD, single episode, severe with psychotic features  Past Medical History:  Past Medical History  Diagnosis Date  .  Headache   . Breast abscess   . Tobacco use in pregnancy   . Low maternal weight gain   . Obesity     History reviewed. No pertinent past surgical history.  Family History:  Family History  Problem Relation Age of Onset  . Stroke Father   . Cancer Paternal Aunt     Social History:  reports that she has been smoking Cigarettes.  She has a 7.5 pack-year smoking history. She has never used smokeless tobacco. She reports that she uses illicit drugs (Marijuana) about 7 times per week. She reports that she does not drink alcohol.  Additional Social History:  Alcohol / Drug Use Pain Medications: denies Prescriptions: denies Over the Counter: denies History of alcohol / drug use?:  (denies) Longest period of sobriety (when/how long):  (denies) Negative Consequences of Use:  (denies) Withdrawal Symptoms:  (denies)  CIWA: CIWA-Ar BP: 121/75 mmHg Pulse Rate: 90 COWS:    PATIENT STRENGTHS: (choose at least two) Ability for insight Average or above average intelligence Capable of independent living Communication skills Motivation for treatment/growth Supportive family/friends Work skills  Allergies: No Known Allergies  Home Medications:  (Not in a hospital admission)  OB/GYN Status:  No LMP recorded.  General Assessment Data Location of Assessment: [facility] TTS Assessment: In system Is this a Tele or Face-to-Face Assessment?: Face-to-Face Is this an Initial Assessment or a Re-assessment for this encounter?: Initial Assessment Marital status: Long term relationship Is patient pregnant?: Unknown Pregnancy Status: Unknown Living Arrangements: Spouse/significant other, Children Can pt return to current living arrangement?: Yes (but doesn't want to) Admission Status: Voluntary Is patient capable  of signing voluntary admission?: Yes Referral Source: Self/Family/Friend Insurance type: MCD     Crisis Care Plan Living Arrangements: Spouse/significant other, Children Name of  Psychiatrist:  (none) Name of Therapist: none  Education Status Is patient currently in school?: No  Risk to self with the past 6 months Suicidal Ideation: No Has patient been a risk to self within the past 6 months prior to admission? : No Suicidal Intent: No Has patient had any suicidal intent within the past 6 months prior to admission? : No Is patient at risk for suicide?: No Suicidal Plan?: No Has patient had any suicidal plan within the past 6 months prior to admission? : No Access to Livingston: No What has been your use of drugs/alcohol within the last 12 months?:  (denies) Previous Attempts/Gestures: No Intentional Self Injurious Behavior: None Family Suicide History: No Recent stressful life event(s): Conflict (Comment) (in relationship) Persecutory voices/beliefs?: No Depression: Yes Depression Symptoms: Insomnia, Tearfulness, Isolating, Loss of interest in usual pleasures, Feeling worthless/self pity, Feeling angry/irritable, Fatigue Substance abuse history and/or treatment for substance abuse?: No Suicide prevention information given to non-admitted patients: Not applicable  Risk to Others within the past 6 months Homicidal Ideation: Yes-Currently Present Does patient have any lifetime risk of violence toward others beyond the six months prior to admission? : No Thoughts of Harm to Others: Yes-Currently Present Comment - Thoughts of Harm to Others:  (hears voices telling her to harm others) Current Homicidal Intent: No Current Homicidal Plan: No Access to Homicidal Livingston: No Identified Victim:  (pt refuses to elaborate) History of harm to others?: No Assessment of Violence: None Noted Does patient have access to weapons?: No Criminal Charges Pending?: No Does patient have a court date: No Is patient on probation?: No  Psychosis Hallucinations: Auditory, Visual Delusions: None noted  Mental Status Report Appearance/Hygiene: Disheveled Eye Contact: Poor Motor  Activity: Restlessness Speech: Logical/coherent Level of Consciousness: Quiet/awake Mood: Depressed, Anxious Affect: Depressed, Anxious Anxiety Level: Panic Attacks Panic attack frequency:  (on admission--1st panic attack) Most recent panic attack:  (on admission) Thought Processes: Coherent, Relevant Judgement: Unimpaired Orientation: Person, Place, Time, Situation, Appropriate for developmental age Obsessive Compulsive Thoughts/Behaviors: None  Cognitive Functioning Concentration: Fair Memory: Recent Intact, Remote Intact IQ: Average Insight: Fair Impulse Control: Good Appetite: Poor Weight Loss: 20 Weight Gain: 0 Sleep: Decreased Total Hours of Sleep: 6 Vegetative Symptoms: None  ADLScreening [facility] Assessment Services) Patient's cognitive ability adequate to safely complete daily activities?: Yes Patient able to express need for assistance with ADLs?: Yes Independently performs ADLs?: Yes (appropriate for developmental age)  Prior Inpatient Therapy Prior Inpatient Therapy: No  Prior Outpatient Therapy Prior Outpatient Therapy: No  ADL Screening (condition at time of admission) Patient's cognitive ability adequate to safely complete daily activities?: Yes Is the patient deaf or have difficulty hearing?: No Does the patient have difficulty seeing, even when wearing glasses/contacts?: No Does the patient have difficulty concentrating, remembering, or making decisions?: No Patient able to express need for assistance with ADLs?: Yes Does the patient have difficulty dressing or bathing?: No Independently performs ADLs?: Yes (appropriate for developmental age) Does the patient have difficulty walking or climbing stairs?: No Weakness of Legs: None Weakness of Arms/Hands: None       Abuse/Neglect Assessment (Assessment to be complete while patient is alone) Physical Abuse: Denies Verbal Abuse: Denies Sexual Abuse: Denies Exploitation of patient/patient's resources:  Denies Self-Neglect: Denies Values / Beliefs Cultural Requests During Hospitalization: None Spiritual Requests During Hospitalization: None   Advance  Directives (For Healthcare) Does patient have an advance directive?: No Would patient like information on creating an advanced directive?: No - patient declined information    Additional Information 1:1 In Past 12 Months?: No CIRT Risk: No Elopement Risk: No Does patient have medical clearance?: Yes     Disposition:  Disposition Initial Assessment Completed for this Encounter: Yes Disposition of Patient: Inpatient treatment program Type of inpatient treatment program: Adult  [facility] 04/11/2016 8:51 AM

## 2016-04-11 NOTE — ED Notes (Signed)
[id number] Expected date:  Expected time:  Means of arrival:  Comments: Room TR 4
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
## 2016-04-11 NOTE — Progress Notes (Signed)
Nursing Note 04/11/2016 1900 - 04/12/16 0730  Data Patient being monitored inpatient for anxiety, depression, auditory command hallucinations telling her to harm people, and visual hallucinations per admission tele-assessment note.  Also being monitored s/p allergic reaction to nicotine patch.  Itching had decreased after administration of benadryl previous shift, c/o continued itching (though improved) and anxiety during assessment for which she requested another benadryl (was too early).  Denied active SI, HI, and AVH for this nurse.  Anxious blunted affect.  Pleasant, interacting with peers and staff.  Attended group.  Action Spoke with patient 1:1, offered to be a support for duration of shift.  Given PRN Vistaril for anxiety, but also for additional benefit of itching.  Patient states she took a shower after having the reaction.  Remained on 15 minute checks for safety.  Response Patient resting quietly in bed with eyes closed.  Remains safe on unit.

## 2016-04-11 NOTE — ED Notes (Signed)
Pt discharged ambulatory with [place] driver.  All belongings were sent with pt.  Patient was calm and cooperative.
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
## 2016-04-11 NOTE — ED Notes (Signed)
TTS in room with PT

## 2016-04-11 NOTE — ED Provider Notes (Signed)
CSN: [id number]     Arrival date & time 04/11/16  0732 History   First MD Initiated Contact with Patient 04/11/16 [phone]     Chief Complaint  Patient presents with  . Homicidal  . Anxiety  . Medical Clearance     (Consider location/radiation/quality/duration/timing/severity/associated sxs/prior Treatment) HPI Comments: Patient here after becoming severely anxious just prior to arrival. States that she has had lots of stress recently and situation occur while she was seen down in her home. Also admits to homicidal ideations but will not elaborate who the records. Denies any suicidal ideations. Denies any pressure psychiatric illness. She missed to recreational alcohol use but denies any cocaine or opiate use but does use marijuana occasionally. Denies any chest pain or anginal symptoms. EMS called and patient was hyperventilating and crying when they arrived. When she was able to relax more her dyspnea since resolved. Denies any recent illnesses.  Patient is a 30 y.o. female presenting with anxiety. The history is provided by the patient.  Anxiety    Past Medical History  Diagnosis Date  . Headache   . Breast abscess   . Tobacco use in pregnancy   . Low maternal weight gain   . Obesity    History reviewed. No pertinent past surgical history. Family History  Problem Relation Age of Onset  . Stroke Father   . Cancer Paternal Aunt    Social History  Substance Use Topics  . Smoking status: Current Every Day Smoker -- 0.50 packs/day for 15 years    Types: Cigarettes  . Smokeless tobacco: Never Used  . Alcohol Use: No     Comment: 2-3 40s a day for several years   OB History    Gravida Para Term Preterm AB TAB SAB Ectopic Multiple Living   4 4 4  0 0 0 0 0  4     Review of Systems  All other systems reviewed and are negative.     Allergies  Review of patient's allergies indicates no known allergies.  Home Medications   Prior to Admission medications   Medication Sig  Start Date End Date Taking? Authorizing Provider  cephALEXin (KEFLEX) 500 MG capsule Take 1 capsule (500 mg total) by mouth 4 (four) times daily. 11/19/15   Everlene FarrierWilliam Dansie, PA-C  methocarbamol (ROBAXIN) 500 MG tablet Take 2 tablets (1,000 mg total) by mouth 4 (four) times daily. 07/06/15   Renne CriglerJoshua Geiple, PA-C  naproxen (NAPROSYN) 250 MG tablet Take 1 tablet (250 mg total) by mouth 2 (two) times daily with a meal. 11/19/15   Everlene FarrierWilliam Dansie, PA-C  ondansetron (ZOFRAN ODT) 4 MG disintegrating tablet Take 1 tablet (4 mg total) by mouth every 8 (eight) hours as needed for nausea or vomiting. 11/19/15   Everlene FarrierWilliam Dansie, PA-C  Prenat-FeFum-FePo-FA-Omega 3 (CONCEPT DHA) 53.5-38-1 MG CAPS Take 1 tablet by mouth daily.  02/12/15   Historical Provider, MD   BP 121/75 mmHg  Pulse 90  Temp(Src) 98.1 F (36.7 C) (Oral)  Resp 18  SpO2 96% Physical Exam  Constitutional: She is oriented to person, place, and time. She appears well-developed and well-nourished.  Non-toxic appearance. No distress.  HENT:  Head: Normocephalic and atraumatic.  Eyes: Conjunctivae, EOM and lids are normal. Pupils are equal, round, and reactive to light.  Neck: Normal range of motion. Neck supple. No tracheal deviation present. No thyroid mass present.  Cardiovascular: Normal rate, regular rhythm and normal heart sounds.  Exam reveals no gallop.   No murmur heard. Pulmonary/Chest: Effort  normal and breath sounds normal. No stridor. No respiratory distress. She has no decreased breath sounds. She has no wheezes. She has no rhonchi. She has no rales.  Abdominal: Soft. Normal appearance and bowel sounds are normal. She exhibits no distension. There is no tenderness. There is no rebound and no CVA tenderness.  Musculoskeletal: Normal range of motion. She exhibits no edema or tenderness.  Neurological: She is alert and oriented to person, place, and time. She has normal strength. No cranial nerve deficit or sensory deficit. GCS eye subscore is  4. GCS verbal subscore is 5. GCS motor subscore is 6.  Skin: Skin is warm and dry. No abrasion and no rash noted.  Psychiatric: Her affect is blunt. Her speech is delayed. She is withdrawn. Thought content is not paranoid and not delusional. She expresses homicidal ideation. She expresses no suicidal ideation. She expresses no suicidal plans. She is inattentive.  Nursing note and vitals reviewed.   ED Course  Procedures (including critical care time) Labs Review Labs Reviewed  COMPREHENSIVE METABOLIC PANEL  ETHANOL  CBC  URINE RAPID DRUG SCREEN, HOSP PERFORMED  I-STAT BETA HCG BLOOD, ED ([facility], [facility], [facility] ONLY)    Imaging Review No results found. I have personally reviewed and evaluated these images and lab results as part of my medical decision-making.   EKG Interpretation None      MDM   Final diagnoses:  None    Patient to be evaluated by the behavior health service.    Lorre Nick, MD 04/11/16 0800

## 2016-04-11 NOTE — Tx Team (Signed)
Initial Interdisciplinary Treatment Plan   PATIENT STRESSORS: Educational concerns Financial difficulties Health problems Legal issue   PATIENT STRENGTHS: Ability for insight Active sense of humor Average or above average intelligence   PROBLEM LIST: Problem List/Patient Goals Date to be addressed Date deferred Reason deferred Estimated date of resolution  Depression with hallucinations 04/11/2016     PTSD 04/11/2016                       " Im a good mother" 04/11/2016     " I love my kids" 04/11/2016                  DISCHARGE CRITERIA:  Ability to meet basic life and health needs Adequate post-discharge living arrangements Improved stabilization in mood, thinking, and/or behavior Medical problems require only outpatient monitoring  PRELIMINARY DISCHARGE PLAN: Attend aftercare/continuing care group  PATIENT/FAMIILY INVOLVEMENT: This treatment plan has been presented to and reviewed with the patient, Kathryn Livingston, and/or family member, .  The patient and family have been given the opportunity to ask questions and make suggestions.  Rich BraveDuke, Makael Stein Lynn 04/11/2016, 6:45 PM

## 2016-04-11 NOTE — ED Notes (Signed)
Pt oriented to room and unit.  Alert and oriented.  Denies S/I and H/I.  States "I have just been going through a lot today"  Pt reassured of her safety.  15 minute checks and video monitoring in place.

## 2016-04-12 DIAGNOSIS — F323 Major depressive disorder, single episode, severe with psychotic features: Principal | ICD-10-CM

## 2016-04-12 MED ORDER — NICOTINE POLACRILEX 2 MG MT GUM
2.0000 mg | CHEWING_GUM | OROMUCOSAL | Status: DC | PRN
  Filled 2016-04-12: qty 1

## 2016-04-12 MED ORDER — SERTRALINE HCL 50 MG PO TABS
50.0000 mg | ORAL_TABLET | Freq: Every day | ORAL | Status: DC
  Administered 2016-04-12 – 2016-04-13 (×2): 50 mg via ORAL
  Filled 2016-04-12 (×5): qty 1

## 2016-04-12 NOTE — Progress Notes (Signed)
Adult Psychoeducational Group Note  Date:  04/12/2016 Time:  9:03 PM  Group Topic/Focus:  Wrap-Up Group:   The focus of this group is to help patients review their daily goal of treatment and discuss progress on daily workbooks.  Participation Level:  Active  Participation Quality:  Appropriate  Affect:  Appropriate  Cognitive:  Alert  Insight: Appropriate  Engagement in Group:  Engaged  Modes of Intervention:  Discussion  Additional Comments:  Patient states, "my day was wonderful". Patient goal for today was "to get myself together, so I can get back home to my kids".  Nahdia Doucet L Jalana Moore 04/12/2016, 9:03 PM

## 2016-04-12 NOTE — [facility] Group Notes (Signed)
Patient attend group. Her day was 10. She meet her goal.

## 2016-04-12 NOTE — Plan of Care (Signed)
Problem: Activity: Goal: Interest or engagement in leisure activities will improve Outcome: Not Progressing Not progressing at this time but she is new to the milieu.

## 2016-04-12 NOTE — [facility] Group Notes (Signed)
[facility] LCSW Aftercare Discharge Planning Group Note  04/12/2016  8:45 AM  Participation Quality: Did Not Attend. Patient invited to participate but declined.  Gidget Quizhpi, MSW, LCSW Clinical Social Worker [facility] [phone]    

## 2016-04-12 NOTE — [facility] Counselor (Signed)
Adult Comprehensive Assessment  Patient ID: Kathryn Livingston, female   DOB: [birth year], 30 y.o.   MRN: [id number]  Information Source: Information source: Patient  Current Stressors:  Family Relationships: Relationship issues with her sister and other family members putting her down and not being helpful.  Relationship with youngest child's father is strained as she reports she does not want to be with him anymore. Housing / Lack of housing: Reports she was stressed with finding housing, however reports she is now living with her boyfriend currently and this will be a positive move. Substance abuse: reports use: THC  Living/Environment/Situation:  Living Arrangements: Spouse/significant other Living conditions (as described by patient or guardian): Reports this is a new place to live but will be consistent and positive as she was living with her sister and she was always putting her down. How long has patient lived in current situation?: just started, about a week What is atmosphere in current home: Loving, Supportive  Family History:  Marital status: Long term relationship Long term relationship, how long?: last few months What types of issues is patient dealing with in the relationship?: currently no reported issues with current relationship. Reports he has stepped up to father her 4 girls and is supportive Are you sexually active?: Yes What is your sexual orientation?: heterosexual Has your sexual activity been affected by drugs, alcohol, medication, or emotional stress?: none reported Does patient have children?: Yes How many children?: 4 How is patient's relationship with their children?: 4 minor children, living in the home ages 75, 12, 15 and 1.  Reports no CPS involvement or past hx of CPS.  Childhood History:  By whom was/is the patient raised?: Mother Additional childhood history information: Reports positive relationship with mother and sister/brother. Reports she had behavioral  issues due to hanging with the wrong crowd, but had a positive upbrining. Description of patient's relationship with caregiver when they were a child: Reports mom was supportive and met her basic needs.  Reports she was a good mother Patient's description of current relationship with people who raised him/her: Positive, remains involved.  How were you disciplined when you got in trouble as a child/adolescent?: did not disclose. Does patient have siblings?: Yes Number of Siblings: 2 Description of patient's current relationship with siblings: Reports strained with her sister as she reports she is judgemental and looks down on sister for different reasons.  Reports she and her brother do get along, but he has his own life. Did patient suffer any verbal/emotional/physical/sexual abuse as a child?: No Did patient suffer from severe childhood neglect?: No Has patient ever been sexually abused/assaulted/raped as an adolescent or adult?: No Was the patient ever a victim of a crime or a disaster?: No Witnessed domestic violence?: No Has patient been effected by domestic violence as an adult?: No  Education:  Highest grade of school patient has completed: Patient went to [employer] in [employer].  and dropped out after 9th grade Currently a student?: No Learning disability?: No  Employment/Work Situation:   Employment situation: Employed Where is patient currently employed?: Works at [employer] long has patient been employed?: over a year Patient's job has been impacted by current illness: No What is the longest time patient has a held a job?: unknown, current job remains stable Where was the patient employed at that time?: NA Has patient ever been in the [employer]?: No Has patient ever served in combat?: No Did You Receive Any Psychiatric Treatment/Services While in Passenger transport manager?:  No Are There Guns or Other Weapons in [place]?: No Are These [place]?: No Who  Could Verify You Are Able To Have These Secured:: Boyfriend  Financial Resources:   Museum/gallery curator resources: Medicaid, Income from spouse, Income from employment Does patient have a Programmer, applications or guardian?: No  Alcohol/Substance Abuse:   What has been your use of drugs/alcohol within the last 12 months?: Reports sporadic use of THC, reports she is trying to quit If attempted suicide, did drugs/alcohol play a role in this?: No Alcohol/Substance Abuse Treatment Hx: Denies past history Has alcohol/substance abuse ever caused legal problems?: No  Social Support System:   Pensions consultant Support System: Fair Astronomer System: Reports she is active with medicaid, [place], Physicist, medical. Reports her oldest child's father supports with child support Type of faith/religion: na How does patient's faith help to cope with current illness?: none  Leisure/Recreation:   Leisure and Hobbies: playing with her children, working, spending time with boyfriend  Strengths/Needs:   What things does the patient do well?: good mother,  In what areas does patient struggle / problems for patient: has not addressed anxiety and depression  Discharge Plan:   Does patient have access to transportation?: Yes Will patient be returning to same living situation after discharge?: Yes Currently receiving community mental health services: No If no, would patient like referral for services when discharged?: Yes (What county?) ([place] Does patient have financial barriers related to discharge medications?: No  Summary/Recommendations:     Kathryn Livingston is an 30 y.o. female who presents voluntarily accompanied by EMS reporting waking up and feeling like she could not breathe. She describes recent symptoms of anxiety, depression, visual hallucinations and hearing voices telling her to harm others for the past two days. When asked about thoughts to harm others, pt states, "I don't want to talk  about that. I know I would not do anything, becasue I have my kids and I don't want to go to jail". Pt denies previous mental health history. Pt reports no medication. Pt denies current suicidal ideation or past attempts. Pt acknowledges symptoms including: relationship problems with her significant other (wants to leave), problems sleeping, isolation from others. PT denies history of violence. Pt lives with her 4 kids and significant other, and supports include her family (aunt, etc.). Pt denies history of abuse and trauma. Pt reports there is a family history of unspecified mental health problems in an aunt. Pt's work history includes currently working at [employer] . Pt has fair insight and judgment. Pt's memory is normal . Pt denies legal history.  LCSW met with patient in her room to complete assessment. She reports she was not suicidal and reports no AVH, but wanted to be started on medication for her anxiety and depression. Reports she is ready to go home and will follow up with outpatient: [facility] for medication management. Reports she was having housing problems with her sister, but since has left her sisters and lives with her boyfriend.  Reports her boyfriend is a cab driver and provides all transportation is supported by boyfriend. Reports she has all resources for kids, just wanted medication for her mood.  Denies all other needs, SI and information at this time. Hopeful for discharge in next day.  She will complete groups with therapist addressing depression/anxiety in hopes of eliminating and learning positive coping skills.     Lilly Cove 04/12/2016

## 2016-04-12 NOTE — [facility] Group Notes (Signed)
[facility] LCSW Group Therapy 04/12/2016  1:15 PM   Type of Therapy: Group Therapy  Participation Level: Did Not Attend. Patient invited to participate but declined.   Charlott Calvario, MSW, LCSW Clinical Social Worker [facility] [phone]   
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
## 2016-04-12 NOTE — Progress Notes (Signed)
Recreation Therapy Notes  Date: 07.18.2017 Time: 9:30am Location: [address] Group Room   Group Topic: Stress Management  Goal Area(s) Addresses:  Patient will actively participate in stress management techniques presented during session.   Behavioral Response: Did not attend.   Jaydian Santana L Dasean Brow, LRT/CTRS        Marigene Erler L 04/12/2016 1:26 PM 

## 2016-04-12 NOTE — H&P (Signed)
Psychiatric Admission Assessment Adult  Patient Identification: Kathryn Livingston MRN:  [id number] Date of Evaluation:  04/12/2016 Chief Complaint:  MDD, single episode, severe with psychotic features Principal Diagnosis: Major depressive disorder, single episode, severe w psychotic behavior ([place]) Diagnosis:   Patient Active Problem List   Diagnosis Date Noted  . Major depressive disorder, single episode, severe w psychotic behavior ([place]) [F32.3] 04/11/2016    Priority: High  . Severe major depression, single episode, with psychotic features ([place]) [F32.3] 04/11/2016  . Alcohol abuse [F10.10] 04/11/2016  . Spontaneous vaginal delivery [O80] 04/20/2015  . Polyhydramnios [O40.9XX0] 04/19/2015  . Indication for care in labor or delivery [O75.9] 03/30/2015  . Labor and delivery, indication for care [O75.9] 03/20/2015  . Polyhydramnios in third trimester [O40.3XX0] 03/17/2015  . Continuous tobacco abuse [Z72.0] 02/26/2015  . Marijuana use [F12.10] 02/26/2015  . Low weight gain in pregnancy [O26.10] 02/26/2015  . Headache in pregnancy [O26.899, R51] 02/26/2015  . Abnormal quad screen [O28.9] 02/26/2015  . Anemia in pregnancy [O99.019] 02/26/2015   History of Present Illness:  Kathryn Livingston is 30 yo, mother of 48, employed at [employer] and living with boyfriend.  She presented to ED after she verbalized symptoms of what she calls a panic attack.  She was short of breath, crying, hyperventilating.  She reported various stressors in her life.  She denied feeling homicidal towards anyone.  She did state that her sister is a great source of stress. She is angry at her sister for being condescending to her and berating her.  She did deny that she ever felt homicidal towards her.  "I mean I pissed at her but that does mean I want to kill her.  I just need to stay away from.  She always says that she's better than me."  Patient denies any mental health history or ever taking psychotropics in the past.  She  reports to using THC and drinking beer every other day.  Associated Signs/Symptoms: Depression Symptoms:  depressed mood, anxiety, (Hypo) Manic Symptoms:  Irritable Mood, Anxiety Symptoms:  Excessive Worry, Psychotic Symptoms:  NA PTSD Symptoms: NA Total Time spent with patient: 45 minutes  Past Psychiatric History: see HPI  Is the patient at risk to self? Yes.    Has the patient been a risk to self in the past 6 months? Yes.    Has the patient been a risk to self within the distant past? Yes.    Is the patient a risk to others? No.  Has the patient been a risk to others in the past 6 months? No.  Has the patient been a risk to others within the distant past? No.   Prior Inpatient Therapy:   Prior Outpatient Therapy:    Alcohol Screening: 1. How often do you have a drink containing alcohol?: Monthly or less 2. How many drinks containing alcohol do you have on a typical day when you are drinking?: 1 or 2 3. How often do you have six or more drinks on one occasion?: Never Preliminary Score: 0 9. Have you or someone else been injured as a result of your drinking?: No 10. Has a relative or friend or a doctor or another health worker been concerned about your drinking or suggested you cut down?: No Alcohol Use Disorder Identification Test Final Score (AUDIT): 1 Brief Intervention: AUDIT score less than 7 or less-screening does not suggest unhealthy drinking-brief intervention not indicated Substance Abuse History in the last 12 months:  Yes.   Consequences of  Substance Abuse: NA Previous Psychotropic Medications: NO Psychological Evaluations: yes Past Medical History:  Past Medical History  Diagnosis Date  . Headache   . Breast abscess   . Tobacco use in pregnancy   . Low maternal weight gain   . Obesity    History reviewed. No pertinent past surgical history. Family History:  Family History  Problem Relation Age of Onset  . Stroke Father   . Cancer Paternal Aunt     Family Psychiatric  History: see HPI Tobacco Screening: _0 ([id number])::1)@ Social History:  History  Alcohol Use No    Comment: 2-3 56s a day for several years     History  Drug Use  . 7.00 per week  . Special: Marijuana    Additional Social History: Marital status: Long term relationship Long term relationship, how long?: last few months What types of issues is patient dealing with in the relationship?: currently no reported issues with current relationship. Reports he has stepped up to father her 4 girls and is supportive Are you sexually active?: Yes What is your sexual orientation?: heterosexual Has your sexual activity been affected by drugs, alcohol, medication, or emotional stress?: none reported Does patient have children?: Yes How many children?: 4 How is patient's relationship with their children?: 4 minor children, living in the home ages 58, 36, 7 and 1.  Reports no CPS involvement or past hx of CPS.    Pain Medications: n/a History of alcohol / drug use?: Yes (" I don't drink anymore")    Allergies:   Allergies  Allergen Reactions  . Nicoderm [Nicotine]    Lab Results:  Results for orders placed or performed during the hospital encounter of 04/11/16 (from the past 48 hour(s))  Comprehensive metabolic panel     Status: Abnormal   Collection Time: 04/11/16  8:21 AM  Result Value Ref Range   Sodium 142 135 - 145 mmol/L   Potassium 3.6 3.5 - 5.1 mmol/L   Chloride 107 101 - 111 mmol/L   CO2 21 (L) 22 - 32 mmol/L   Glucose, Bld 56 (L) 65 - 99 mg/dL   BUN 12 6 - 20 mg/dL   Creatinine, Ser 0.64 0.44 - 1.00 mg/dL   Calcium 9.1 8.9 - 10.3 mg/dL   Total Protein 8.4 (H) 6.5 - 8.1 g/dL   Albumin 5.2 (H) 3.5 - 5.0 g/dL   AST 23 15 - 41 U/L   ALT 18 14 - 54 U/L   Alkaline Phosphatase 84 38 - 126 U/L   Total Bilirubin 1.1 0.3 - 1.2 mg/dL   GFR calc non Af Amer >60 >60 mL/min   GFR calc Af Amer >60 >60 mL/min    Comment: (NOTE) The eGFR has been calculated  using the CKD EPI equation. This calculation has not been validated in all clinical situations. eGFR's persistently <60 mL/min signify possible Chronic Kidney Disease.    Anion gap 14 5 - 15  Ethanol     Status: Abnormal   Collection Time: 04/11/16  8:21 AM  Result Value Ref Range   Alcohol, Ethyl (B) 68 (H) <5 mg/dL    Comment:        LOWEST DETECTABLE LIMIT FOR SERUM ALCOHOL IS 5 mg/dL FOR MEDICAL PURPOSES ONLY   cbc     Status: None   Collection Time: 04/11/16  8:21 AM  Result Value Ref Range   WBC 8.7 4.0 - 10.5 K/uL   RBC 4.10 3.87 - 5.11 MIL/uL   Hemoglobin 12.4 12.0 -  15.0 g/dL   HCT 37.1 36.0 - 46.0 %   MCV 90.5 78.0 - 100.0 fL   MCH 30.2 26.0 - 34.0 pg   MCHC 33.4 30.0 - 36.0 g/dL   RDW 14.3 11.5 - 15.5 %   Platelets 347 150 - 400 K/uL  I-Stat beta hCG blood, ED ([facility], [facility], [facility] only)     Status: None   Collection Time: 04/11/16  8:27 AM  Result Value Ref Range   I-stat hCG, quantitative <5.0 <5 mIU/mL   Comment 3            Comment:   GEST. AGE      CONC.  (mIU/mL)   <=1 WEEK        5 - 50     2 WEEKS       50 - 500     3 WEEKS       100 - 10,000     4 WEEKS     1,000 - 30,000        FEMALE AND NON-PREGNANT FEMALE:     LESS THAN 5 mIU/mL   Urine rapid drug screen (hosp performed)     Status: Abnormal   Collection Time: 04/11/16 10:30 AM  Result Value Ref Range   Opiates NONE DETECTED NONE DETECTED   Cocaine NONE DETECTED NONE DETECTED   Benzodiazepines NONE DETECTED NONE DETECTED   Amphetamines NONE DETECTED NONE DETECTED   Tetrahydrocannabinol POSITIVE (A) NONE DETECTED   Barbiturates NONE DETECTED NONE DETECTED    Comment:        DRUG SCREEN FOR MEDICAL PURPOSES ONLY.  IF CONFIRMATION IS NEEDED FOR ANY PURPOSE, NOTIFY LAB WITHIN 5 DAYS.        LOWEST DETECTABLE LIMITS FOR URINE DRUG SCREEN Drug Class       Cutoff (ng/mL) Amphetamine      1000 Barbiturate      200 Benzodiazepine   433 Tricyclics       295 Opiates          300 Cocaine           300 THC              50     Blood Alcohol level:  Lab Results  Component Value Date   ETH 68* 18/84/1660    Metabolic Disorder Labs:  No results found for: HGBA1C, MPG No results found for: PROLACTIN No results found for: CHOL, TRIG, HDL, CHOLHDL, VLDL, LDLCALC  Current Medications: Current Facility-Administered Medications  Medication Dose Route Frequency Provider Last Rate Last Dose  . acetaminophen (TYLENOL) tablet 650 mg  650 mg Oral Q6H PRN Patrecia Pour, NP      . alum & mag hydroxide-simeth (MAALOX/MYLANTA) 200-200-20 MG/5ML suspension 30 mL  30 mL Oral Q4H PRN Patrecia Pour, NP      . diphenhydrAMINE (BENADRYL) capsule 25 mg  25 mg Oral Q6H PRN Jenne Campus, MD   25 mg at 04/11/16 1825  . hydrOXYzine (ATARAX/VISTARIL) tablet 25 mg  25 mg Oral Q6H PRN Patrecia Pour, NP   25 mg at 04/11/16 1958  . ibuprofen (ADVIL,MOTRIN) tablet 600 mg  600 mg Oral Q8H PRN Patrecia Pour, NP      . magnesium hydroxide (MILK OF MAGNESIA) suspension 30 mL  30 mL Oral Daily PRN Patrecia Pour, NP      . nicotine polacrilex (NICORETTE) gum 2 mg  2 mg Oral PRN Jenne Campus, MD      .  ondansetron (ZOFRAN) tablet 4 mg  4 mg Oral Q8H PRN Patrecia Pour, NP      . sertraline (ZOLOFT) tablet 50 mg  50 mg Oral Daily Myer Peer Curtis Cain, MD      . traZODone (DESYREL) tablet 50 mg  50 mg Oral QHS PRN Patrecia Pour, NP       PTA Medications: No prescriptions prior to admission    Musculoskeletal: Strength & Muscle Tone: within normal limits Gait & Station: normal Patient leans: N/A  Psychiatric Specialty Exam: Physical Exam  ROS  Blood pressure 117/74, pulse 97, temperature 98.6 F (37 C), temperature source Oral, resp. rate 18, height _0  (1.676 m), weight 94.036 kg (207 lb 5 oz), last menstrual period 04/09/2016, SpO2 100 %, not currently breastfeeding.Body mass index is 33.48 kg/(m^2).  General Appearance: Fairly Groomed  Eye Contact:  Good  Speech:  Normal Rate  Volume:  Normal   Mood:  Anxious  Affect:  Appropriate  Thought Process:  Coherent  Orientation:  Full (Time, Place, and Person)  Thought Content:  Rumination  Suicidal Thoughts:  No  Homicidal Thoughts:  No  Memory:  Immediate;   Fair Recent;   Fair Remote;   Fair  Judgement:  Fair  Insight:  Fair  Psychomotor Activity:  Normal  Concentration:  Concentration: Good and Attention Span: Good  Recall:  Good  Fund of Knowledge:  Good  Language:  Good  Akathisia:  No  Handed:  Right  AIMS (if indicated):     Assets:  Resilience  ADL's:  Intact  Cognition:  WNL  Sleep:  poor   Treatment Plan Summary:  Admit for crisis management and mood stabilization. Medication management to re-stabilize current mood symptoms -  Zoloft 50 mgrs QDAY. Trazodone PRNs for insomnia, as needed, Vistaril PRNs for anxiety Group counseling sessions for coping skills Medical consults as needed Review and reinstate any pertinent home medications for other health problems   Observation Level/Precautions:  15 minute checks  Laboratory:  per ed, added TSH  Psychotherapy:  group  Medications:  As per medlist  Consultations:  As needed  Discharge Concerns:  safety  Estimated LOS:  2-7 days  Other:     I certify that inpatient services furnished can reasonably be expected to improve the patient's condition.    Janett Labella, NP [facility], [place] 7/17/20173:11 PM I have discussed case with NP and seen patient  Agree with NP assessment as above  2 year old single  female , lives with boyfriend, has four daughters , three of whom live with family members, youngest child ( one year old ) with boyfriend. Currently employed at a [employer]. Patient presented to ED reporting difficulty breathing, which was felt to be related to a panic attack- during assessment she reported some vague homicidal ideations, although did not specify towards whom.  Of note, patient denies history of panic disorder, and states recent panic  attack was isolated event, denies agoraphobia. Patient states she has been feeling depressed, sad, and describes neuro-vegetative symptoms such as poor sleep, poor appetite, low energy level , some degree of anhedonia, but denies any suicidal ideations. She has been facing some stressors, mainly a difficult relationship with her sister, and limited support system, states for example that the father of one of her children stated he would not provide financial support because they were no longer together. Attributes depression,at least partially, related to poor relationship with sister, whom she states is hypercritical ,  demanding and constantly belittles her . She denies any prior psychiatric admissions, has never attempted suicide, denies history of self cutting, no history of violence, denies history of psychosis, No clear history of mania or hypomania. States she has never been on any psychiatric medications in the past . Reports occasional cannabis abuse, states she drinks " a couple of beers" every 2-3 days, denies any pattern of alcohol abuse . Admission BAL 68  Denies medical  Illnesses. Allergic to Nicoderm- localized itching  Parents alive, separated , has three half siblings. States she has an aunt who has history of depression, no suicides in family, no history of substance abuse in family  Dx- Major Depression, No Psychotic Features  Plan- agrees to antidepressant trial for depression and anxiety- options reviewed, agrees with Zoloft 50 mgrs QDAY. Trazodone PRNs for insomnia, as needed, Vistaril PRNs for anxiety

## 2016-04-12 NOTE — Progress Notes (Signed)
Patient ID: Kathryn Livingston, female   DOB: 10/02/1985, 30 y.o.   MRN: [id number]  DAR: Pt. Denies SI/HI and A/V Hallucinations. She reports sleep is good, appetite is good, energy level is normal, and concentration is good. She rates depression 0/10, hopelessness 0/10, and anxiety 0/10. Patient does not report any pain or discomfort at this time. Support and encouragement provided to the patient. No scheduled medications at this time. She denies anymore issues with itching or rash after Nicotine patch yesterday. Patient is minimal and isolative at this time staying in her bed throughout most of the morning. She came to Clinical research associatewriter before lunch and she states, "I feel fine. I'm ready to go home. The doctor yesterday said I could go home tomorrow." Patient's affect is flat and mood depressed. Q15 minute checks are maintained for safety.

## 2016-04-12 NOTE — Tx Team (Signed)
Interdisciplinary Treatment Plan Update (Adult) Date: 04/12/2016    Time Reviewed: 9:30 AM  Progress in Treatment: Attending groups: Continuing to assess, patient new to milieu Participating in groups: Continuing to assess, patient new to milieu Taking medication as prescribed: Yes Tolerating medication: Yes Family/Significant other contact made: No, CSW assessing for appropriate contacts Patient understands diagnosis: Yes Discussing patient identified problems/goals with staff: Yes Medical problems stabilized or resolved: Yes Denies suicidal/homicidal ideation: Yes Issues/concerns per patient self-inventory: Yes Other:  New problem(s) identified: N/A  Discharge Plan or Barriers: CSW continuing to assess, patient new to milieu.  Reason for Continuation of Hospitalization:  Depression Anxiety Medication Stabilization   Comments: N/A  Estimated length of stay: 3-5 days    Patient is a 30 year old female with a diagnosis of Major Depressive Disorder with psychotic features. Pt presented to the hospital with increased anxiety, depression, and hallucinations. Pt reports primary trigger(s) for admission include relationship problems with significant other, difficulty sleeping, and isolation. Patient will benefit from crisis stabilization, medication evaluation, group therapy and psycho education in addition to case management for discharge planning. At discharge, it is recommended that Pt remain compliant with established discharge plan and continued treatment.   Review of initial/current patient goals per problem list:  1. Goal(s): Patient will participate in aftercare plan   Met: No   Target date: 3-5 days post admission date   As evidenced by: Patient will participate within aftercare plan AEB aftercare provider and housing plan at discharge being identified.  7/17: Goal not met: CSW assessing for appropriate referrals for pt and will have follow up secured prior to  d/c.    2. Goal (s): Patient will exhibit decreased depressive symptoms and suicidal ideations.   Met: No   Target date: 3-5 days post admission date   As evidenced by: Patient will utilize self rating of depression at 3 or below and demonstrate decreased signs of depression or be deemed stable for discharge by MD.  7/17: Patient admitted with high levels of depression.   3. Goal(s): Patient will demonstrate decreased signs and symptoms of anxiety.   Met: No   Target date: 3-5 days post admission date   As evidenced by: Patient will utilize self rating of anxiety at 3 or below and demonstrated decreased signs of anxiety, or be deemed stable for discharge by MD  7/17: Patient admitted with high levels of anxiety.     Attendees: Patient:    Family:    Physician: Dr. Parke Poisson 04/12/2016 9:30 AM  Nursing: Mayra Neer, Gaylan Gerold, RN 04/12/2016 9:30 AM  Clinical Social Worker: Tilden Fossa, LCSW 04/12/2016 9:30 AM  Other: Peri Maris, LCSWA; Ginger Blue, LCSW  04/12/2016 9:30 AM  Other:  04/12/2016 9:30 AM  Other:  04/12/2016 9:30 AM  Other: Agustina Caroli, May Augustin, NP 04/12/2016 9:30 AM  Other:    Other:           Scribe for Treatment Team:  Tilden Fossa, [place]

## 2016-04-12 NOTE — Progress Notes (Signed)
[facility] Group Notes:  (Nursing/MHT/Case Management/Adjunct)  Date:  04/12/2016  Time:  7:20 PM  Type of Therapy:  Psychoeducational Skills  Participation Level:  Did Not Attend  Summary of Progress/Problems: Pt did not attend group. Pt remained in bed.   Karleen HampshireFox, Jamilya Sarrazin Brittini 04/12/2016, 7:20 PM

## 2016-04-13 LAB — TSH: TSH: 1.171 u[IU]/mL (ref 0.350–4.500)

## 2016-04-13 MED ORDER — HYDROXYZINE HCL 25 MG PO TABS: 25.0000 mg | ORAL_TABLET | Freq: Four times a day (QID) | ORAL | Status: DC | PRN

## 2016-04-13 MED ORDER — NICOTINE POLACRILEX 2 MG MT GUM: 2.0000 mg | CHEWING_GUM | OROMUCOSAL | Status: DC | PRN

## 2016-04-13 MED ORDER — TRAZODONE HCL 50 MG PO TABS: 50.0000 mg | ORAL_TABLET | Freq: Every evening | ORAL | Status: DC | PRN

## 2016-04-13 MED ORDER — SERTRALINE HCL 50 MG PO TABS: 50.0000 mg | ORAL_TABLET | Freq: Every day | ORAL | Status: DC

## 2016-04-13 NOTE — [facility] Suicide Risk Assessment (Signed)
[facility] Discharge Suicide Risk Assessment   Principal Problem: Major depressive disorder, single episode, severe w psychotic behavior [facility]) Discharge Diagnoses:  Patient Active Problem List   Diagnosis Date Noted  . 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 Alcohol abuse [F10.10] 04/11/2016  . 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 Spontaneous vaginal delivery [O80] 04/20/2015  . Polyhydramnios [O40.9XX0] 04/19/2015  . Indication for care in labor or delivery [O75.9] 03/30/2015  . Labor and delivery, indication for care [O75.9] 03/20/2015  . Polyhydramnios in third trimester [O40.3XX0] 03/17/2015  . Continuous tobacco abuse [Z72.0] 02/26/2015  . Marijuana use [F12.10] 02/26/2015  . Low weight gain in pregnancy [O26.10] 02/26/2015  . Headache in pregnancy [O26.899, R51] 02/26/2015  . Abnormal quad screen [O28.9] 02/26/2015  . Anemia in pregnancy [O99.019] 02/26/2015    Total Time spent with patient: 30 minutes  Musculoskeletal: Strength & Muscle Tone: within normal limits Gait & Station: normal Patient leans: N/A  Psychiatric Specialty Exam: ROS  No headache, no chest pain, no shortness of breath, no vomiting , no rash   Blood pressure 121/81, pulse 88, temperature 98.9 F (37.2 C), temperature source Oral, resp. rate 18, height  (1.676 m), weight 207 lb 5 oz (94.036 kg), last menstrual period 04/09/2016, SpO2 100 %, not currently breastfeeding.Body mass index is 33.48 kg/(m^2).  General Appearance: Well Groomed  Eye Contact::  Good  Speech:  Normal Rate409  Volume:  Normal  Mood:  improved, and states mood " back to normal"  Affect:  Appropriate and more reactive   Thought Process:  Linear  Orientation:  Full (Time, Place, and Person)  Thought Content:  denies hallucinations, no delusions   Suicidal Thoughts:  No- denies any suicidal ideations, no self injurious  ideations  Homicidal Thoughts:  No- no violent or homicidal ideations   Memory:  recent and remote grossly intact   Judgement:  Other:  improved   Insight:  improved   Psychomotor Activity:  Normal  Concentration:  Good  Recall:  Good  Fund of Knowledge:Good  Language: Good  Akathisia:  Negative  Handed:  Right  AIMS (if indicated):     Assets:  Desire for Improvement Resilience  Sleep:  Number of Hours: 6.5  Cognition: WNL  ADL's:  Intact   Mental Status Per Nursing Assessment::   On Admission:  Suicidal ideation indicated by patient  Demographic Factors:  30 year old female, lives with boyfriend, has four children   Loss Factors: Strained family relationships, particularly with a sister   Historical Factors: This is her first psychiatric admission, no history of suicide attempts  Risk Reduction Factors:   Responsible for children under 67 years of age, Sense of responsibility to family, Living with another person, especially a relative and Positive coping skills or problem solving skills  Continued Clinical Symptoms:  Alert and attentive, improved grooming, mood improved, and at this time denies feeling depressed, affect more reactive, no thought disorder, no suicidal ideations, no homicidal ideations, no psychotic symptoms, future oriented . Denies medication side effects  Cognitive Features That Contribute To Risk:  No gross cognitive deficits noted upon discharge. Is alert , attentive, and oriented x 3   Suicide Risk:  Mild:  Suicidal ideation of limited frequency, intensity, duration, and specificity.  There are no identifiable plans, no associated intent, mild dysphoria and related symptoms, good self-control (both objective and subjective assessment), few other risk factors, and identifiable  protective factors, including available and accessible social support.    Plan Of Care/Follow-up recommendations:  Activity:  as tolerated  Diet:  Regular Tests:   NA Other:  see below  Patient is requesting discharge and there are no current grounds for involuntary commitment , she is leaving unit in good spirits States BF will pick her up later today, plans to return home  Follow up as above Nehemiah MassedOBOS, Andera Cranmer, MD 04/13/2016, 10:25 AM

## 2016-04-13 NOTE — Progress Notes (Signed)
Patient ID: Kathryn Livingston, female   DOB: 03/18/1986, 30 y.o.   MRN: [id number] D: Client visible on the unit, has visit from BF tonight, reports goal "discharge, doing what I suppose to do to go home" Client denies AVH. "the pill they gave me for itching is not working, I need that little white pill they gave me the other night" A: Write provided emotional support, reviewed previous medications given, noted Vistaril was administered the previous night, administered that to client. Staff will monitor q3315min for safety. R: Client is safe on the unit, attended group.

## 2016-04-13 NOTE — Progress Notes (Signed)
  [facility] :  Will you be returning to the same living situation after discharge:  Yes,  patient plans to return home At discharge, do you have transportation home?: Yes,  boyfriend Do you have the ability to pay for your medications: Yes, patient will be provided with prescriptions at discharge  Release of information consent forms completed and in the chart;  Patient's signature needed at discharge.  Patient to Follow up at: Follow-up Information    [facility]   Specialty:  Behavioral Health   Why:  Walk-in clinic Monday-Friday at 8am for assessment for therapy and medication management services.    Contact information:   [address] [place] [address] [phone]       Next level of care provider has [facility] to [facility] Link:no  Safety Planning and Suicide Prevention discussed: Yes,  with patient  Have you used any form of tobacco in the last 30 days? (Cigarettes, Smokeless Tobacco, Cigars, and/or Pipes): Yes  Has patient been referred to the Quitline?: Patient refused referral  Patient has been referred for addiction treatment: Yes  Jojo Pehl, [place]boKristin L 04/13/2016, 11:14 AM

## 2016-04-13 NOTE — [facility] Suicide Risk Assessment (Signed)
[facility] INPATIENT:  Family/Significant Other Suicide Prevention Education  Suicide Prevention Education:  Patient Refusal for Family/Significant Other Suicide Prevention Education: The [name] has refused to provide written consent for family/significant other to be provided Family/Significant Other Suicide Prevention Education during admission and/or prior to discharge.  Physician notified. SPE reviewed with patient and brochure provided. Patient encouraged to return to hospital if having suicidal thoughts, patient verbalized his/her understanding and has no further questions at this time.   Gustav Knueppel, West CarboKristin L 04/13/2016, 11:13 AM

## 2016-04-13 NOTE — Progress Notes (Signed)
Discharge note: Pt received both written and verbal discharge instructions. Pt verbalized understanding of discharge instructions. Pt agreed to f/u appt and med regimen. Pt given prescriptions, SRA, AVS and transition record. Pt safely discharged to the lobby.

## 2016-04-13 NOTE — Discharge Summary (Signed)
Physician Discharge Summary Note  Patient:  Kathryn Livingston is an 30 y.o., female MRN:  [id number] DOB:  07/02/1986 Patient phone:  [phone] (home)  Patient address:   [address] [place] [id number],  Total Time spent with patient: 45 minutes  Date of Admission:  04/11/2016 Date of Discharge: 04/13/2016  Reason for Admission:  MDD  Principal Problem: Major depressive disorder, single episode, severe w psychotic behavior [facility](HCC) Discharge Diagnoses: Patient Active Problem List   Diagnosis Date Noted  . Major depressive disorder, single episode, severe w psychotic behavior (HCC) [F32.3] 04/11/2016    Priority: High  . 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 Alcohol abuse [F10.10] 04/11/2016  . Spontaneous vaginal delivery [O80] 04/20/2015  . Polyhydramnios [O40.9XX0] 04/19/2015  . Indication for care in labor or delivery [O75.9] 03/30/2015  . Labor and delivery, indication for care [O75.9] 03/20/2015  . Polyhydramnios in third trimester [O40.3XX0] 03/17/2015  . Continuous tobacco abuse [Z72.0] 02/26/2015  . Marijuana use [F12.10] 02/26/2015  . Low weight gain in pregnancy [O26.10] 02/26/2015  . Headache in pregnancy [O26.899, R51] 02/26/2015  . Abnormal quad screen [O28.9] 02/26/2015  . Anemia in pregnancy [O99.019] 02/26/2015    Past Psychiatric History:  See HPI  Past Medical History:  Past Medical History  Diagnosis Date  . Headache   . Breast abscess   . Tobacco use in pregnancy   . Low maternal weight gain   . Obesity    History reviewed. No pertinent past surgical history. Family History:  Family History  Problem Relation Age of Onset  . Stroke Father   . Cancer Paternal Aunt    Family Psychiatric  History:  See HPI Social History:  History  Alcohol Use No    Comment: 2-3 40s a day for several years     History  Drug Use  . 7.00 per week  . Special: Marijuana    Social History    Social History  . Marital Status: Single    Spouse Name: N/A  . Number of Children: N/A  . Years of Education: N/A   Social History Main Topics  . Smoking status: Current Every Day Smoker -- 0.50 packs/day for 15 years    Types: Cigarettes  . Smokeless tobacco: Never Used  . Alcohol Use: No     Comment: 2-3 40s a day for several years  . Drug Use: 7.00 per week    Special: Marijuana  . Sexual Activity: Yes    Birth Control/ Protection: None   Other Topics Concern  . None   Social History Narrative   ** Merged History Encounter **       Hospital Course:   Randall Hissatasha Geigle 30 yo, came in to ED initially with complaints of panic attacks symptoms (chest pain). Kathryn PlanasNatasha D Lavery was admitted for Major depressive disorder, single episode, severe w psychotic behavior (HCC) and crisis management.  She was treated with medications and their indications listed below.  Medical problems were identified and treated as needed.  Home medications were restarted as appropriate.  Improvement was monitored by observation and Minda DittoNatasha D Palecek daily report of symptom reduction.  Emotional and mental status was monitored by daily self inventory reports completed by Kathryn PlanasNatasha D Gassett and clinical staff.  Patient reported continued improvement, denied any new concerns.  Patient had been compliant on medications and denied side effects.  Support and encouragement was provided.    At time of discharge, patient  rated both depression and anxiety levels to be manageable and minimal.  Patient encouraged to attend groups to help with recognizing triggers of emotional crises and de-stabilizations.  Patient encouraged to attend group to help identify the positive things in life that would help in dealing with feelings of loss, depression and unhealthy or abusive tendencies.         KUUIPO ANZALDO was evaluated by the treatment team for stability and plans for continued recovery upon discharge.  She was offered further  treatment options upon discharge including Residential, Intensive Outpatient and Outpatient treatment. She will follow up with agency listed below for medication management and counseling.  Encouraged patient to maintain satisfactory support network and home environment.  Advised to adhere to medication compliance and outpatient treatment follow up.  Prescriptions provided.       Kathryn Planas motivation was an integral factor for scheduling further treatment.  Employment, transportation, bed availability, health status, family support, and any pending legal issues were also considered during her hospital stay.  Upon completion of this admission the patient was both mentally and medically stable for discharge denying suicidal/homicidal ideation, auditory/visual/tactile hallucinations, delusional thoughts and paranoia.      Physical Findings: AIMS: Facial and Oral Movements Muscles of Facial Expression: None, normal Lips and Perioral Area: None, normal Jaw: None, normal Tongue: None, normal,Extremity Movements Upper (arms, wrists, hands, fingers): None, normal Lower (legs, knees, ankles, toes): None, normal, Trunk Movements Neck, shoulders, hips: None, normal, Overall Severity Severity of abnormal movements (highest score from questions above): None, normal Incapacitation due to abnormal movements: None, normal Patient's awareness of abnormal movements (rate only patient's report): No Awareness, Dental Status Current problems with teeth and/or dentures?: No Does patient usually wear dentures?: No  CIWA:  CIWA-Ar Total: 0 COWS:  COWS Total Score: 0  Musculoskeletal: Strength & Muscle Tone: within normal limits Gait & Station: normal Patient leans: N/A  Psychiatric Specialty Exam:  SEE MD SRA Physical Exam  Nursing note and vitals reviewed. Psychiatric: She has a normal mood and affect. Her speech is normal and behavior is normal. Thought content normal.    Review of Systems  All other  systems reviewed and are negative.   Blood pressure 121/81, pulse 88, temperature 98.9 F (37.2 C), temperature source Oral, resp. rate 18, height 5\' 6"  (1.676 m), weight 94.036 kg (207 lb 5 oz), last menstrual period 04/09/2016, SpO2 100 %, not currently breastfeeding.Body mass index is 33.48 kg/(m^2).    Have you used any form of tobacco in the last 30 days? (Cigarettes, Smokeless Tobacco, Cigars, and/or Pipes): Yes  Has this patient used any form of tobacco in the last 30 days? (Cigarettes, Smokeless Tobacco, Cigars, and/or Pipes) Yes, Rx given to patient  Blood Alcohol level:  Lab Results  Component Value Date   ETH 68* 04/11/2016    Metabolic Disorder Labs:  No results found for: HGBA1C, MPG No results found for: PROLACTIN No results found for: CHOL, TRIG, HDL, CHOLHDL, VLDL, LDLCALC  See Psychiatric Specialty Exam and Suicide Risk Assessment completed by Attending Physician prior to discharge.  Discharge destination:  Home  Is patient on multiple antipsychotic therapies at discharge:  No   Has Patient had three or more failed trials of antipsychotic monotherapy by history:  No  Recommended Plan for Multiple Antipsychotic Therapies: NA     Medication List    TAKE these medications      Indication   hydrOXYzine 25 MG tablet  Commonly known as:  ATARAX/VISTARIL  Take 1 tablet (25 mg total) by mouth every 6 (six) hours as needed for anxiety.   Indication:  Anxiety Neurosis     nicotine polacrilex 2 MG gum  Commonly known as:  NICORETTE  Take 1 each (2 mg total) by mouth as needed for smoking cessation.   Indication:  Nicotine Addiction     sertraline 50 MG tablet  Commonly known as:  ZOLOFT  Take 1 tablet (50 mg total) by mouth daily.   Indication:  Major Depressive Disorder     traZODone 50 MG tablet  Commonly known as:  DESYREL  Take 1 tablet (50 mg total) by mouth at bedtime as needed for sleep.   Indication:  Trouble Sleeping        Follow-up  recommendations:  Activity:  as tol Diet:  as tol  Comments:  1.  Take all your medications as prescribed.   2.  Report any adverse side effects to outpatient provider. 3.  Patient instructed to not use alcohol or illegal drugs while on prescription medicines. 4.  In the event of worsening symptoms, instructed patient to call 911, the crisis hotline or go to nearest emergency room for evaluation of symptoms.  Signed: Lindwood Qua, NP  [facility]  04/13/2016, 10:40 AM  Patient seen, Suicide Assessment Completed.  Disposition Plan Reviewed

## 2016-05-06 ENCOUNTER — Encounter (HOSPITAL_COMMUNITY): Payer: Self-pay | Admitting: *Deleted

## 2016-05-06 ENCOUNTER — Emergency Department (HOSPITAL_COMMUNITY)
Admission: EM | Admit: 2016-05-06 | Discharge: 2016-05-06 | Disposition: A | Discharge status: Home / Self Care | Payer: Medicaid Other | Attending: Dermatology | Admitting: Dermatology

## 2016-05-06 DIAGNOSIS — F1290 Cannabis use, unspecified, uncomplicated: Secondary | ICD-10-CM | POA: Diagnosis not present

## 2016-05-06 DIAGNOSIS — F329 Major depressive disorder, single episode, unspecified: Secondary | ICD-10-CM | POA: Diagnosis not present

## 2016-05-06 DIAGNOSIS — Z5321 Procedure and treatment not carried out due to patient leaving prior to being seen by health care provider: Secondary | ICD-10-CM | POA: Insufficient documentation

## 2016-05-06 DIAGNOSIS — F17210 Nicotine dependence, cigarettes, uncomplicated: Secondary | ICD-10-CM | POA: Insufficient documentation

## 2016-05-06 HISTORY — DX: Bipolar disorder, unspecified: F31.9

## 2016-05-06 HISTORY — DX: Schizophrenia, unspecified: F20.9

## 2016-05-06 NOTE — ED Notes (Signed)
Pt refused blood work earlier then patient asked could she leave because she didn't need any help.  Pt was instructed to sign out AMA and directed to leave based on her wishes.

## 2016-05-06 NOTE — ED Notes (Signed)
Patient refused lab draw. Triage RN aware. 

## 2016-05-06 NOTE — ED Notes (Signed)
Pt states "I don't want to be here. I just want to take my night time medications and go to sleep."; pt states "I wouldn't be here if they didn't make me come"; pt refusing blood draw

## 2016-05-06 NOTE — ED Triage Notes (Signed)
Pt arrives to the ER via EMS' husband called EMS bc he reports that pt was attempting to take more than her prescribed medication; husband advised EMS that he had to take the medication away from pt; pt denies SI / HI; pt states 'I just want to take my medication and go to sleep"; Pt denies wanting to overdose tonight

## 2017-04-15 IMAGING — US US OB FOLLOW-UP
1 series · 14 of 28 positions shown · non-contrast
Comparison: none

[Series 1: us ob follow-up · 0.28mm/px · 14 of 36 slices shown]
[im 2/36]
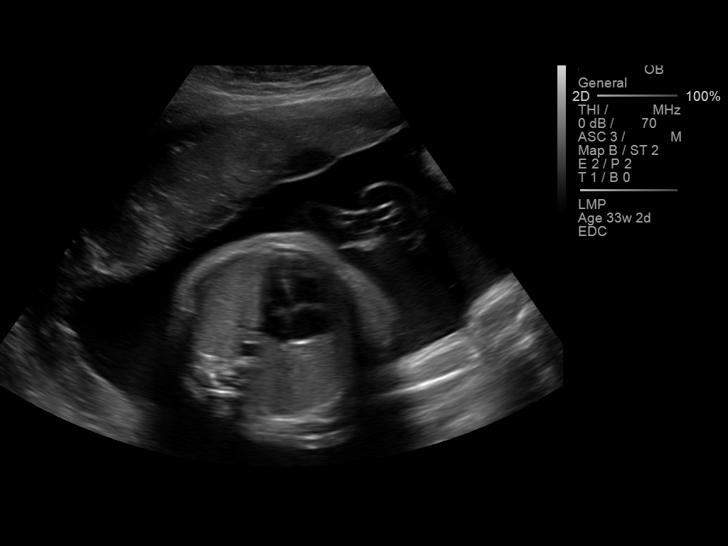
[im 4/36]
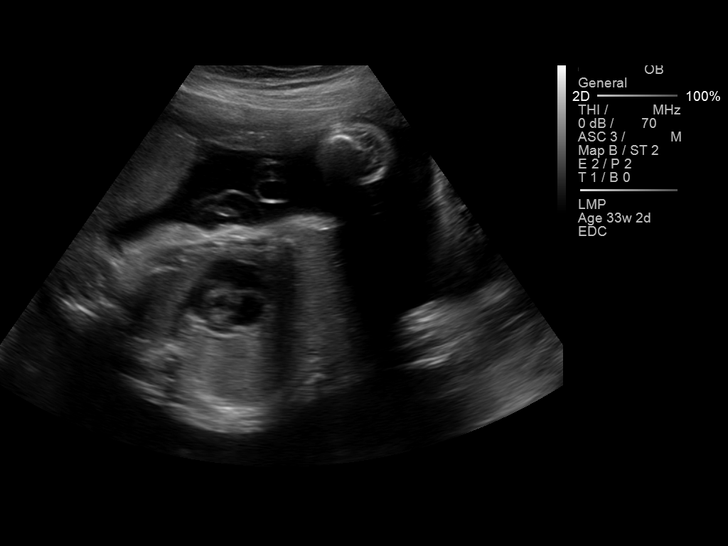
[im 7/36]
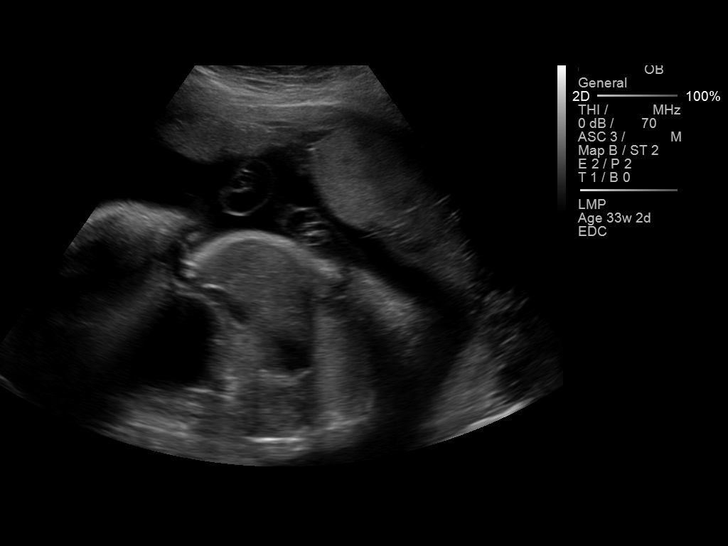
[im 10/36]
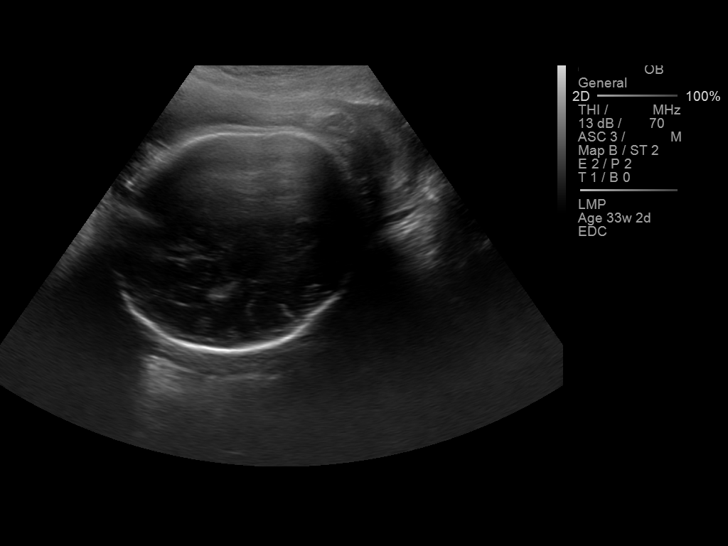
[im 12/36]
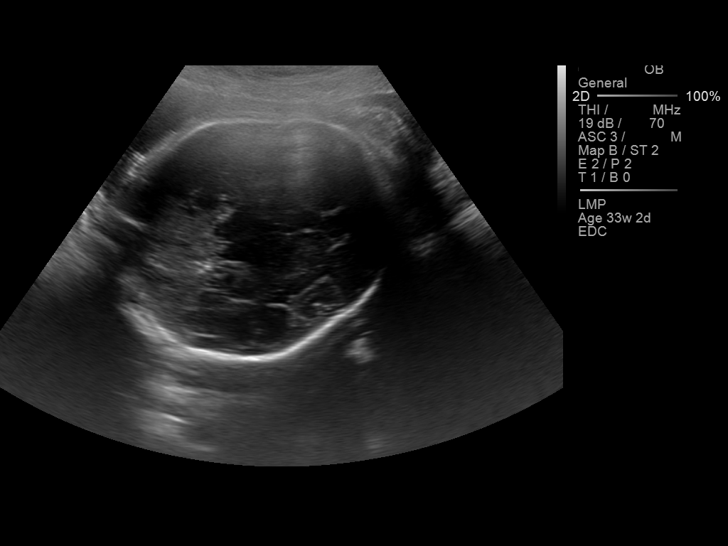
[im 15/36]
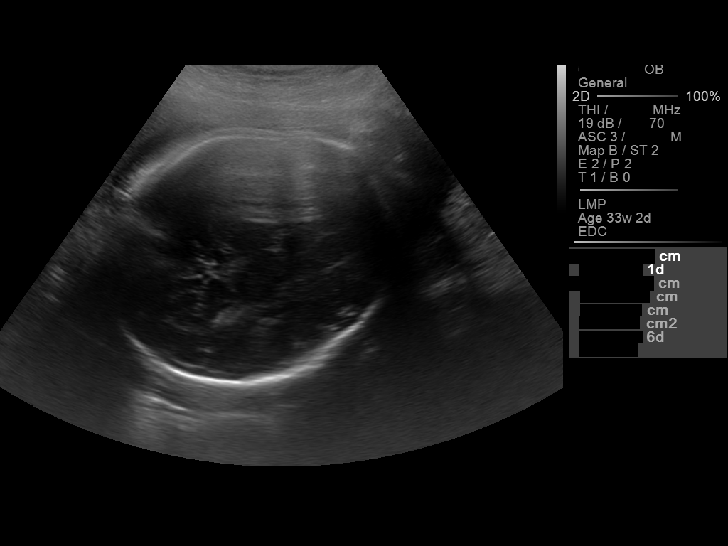
[im 17/36]
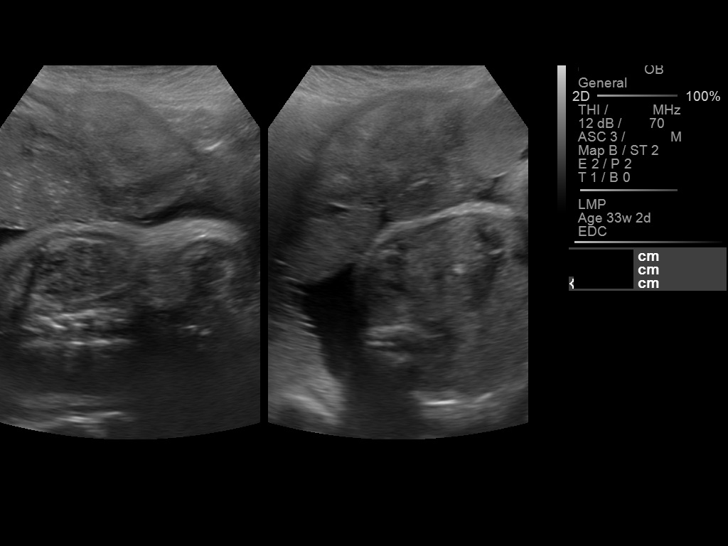
[im 20/36]
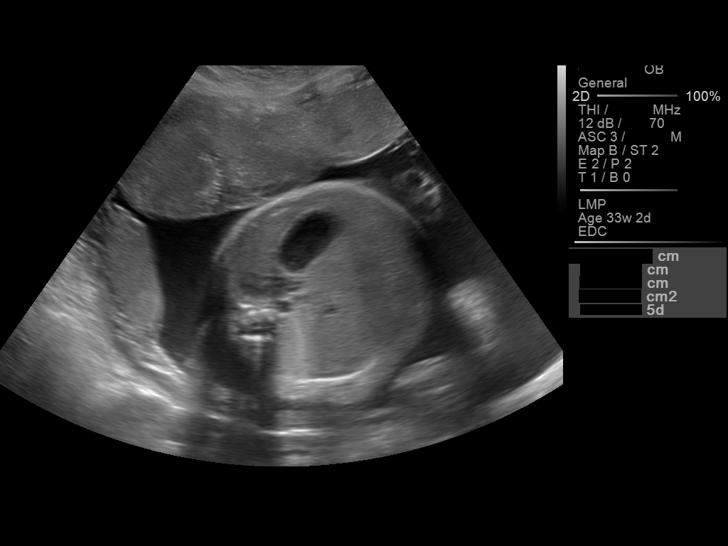
[im 23/36]
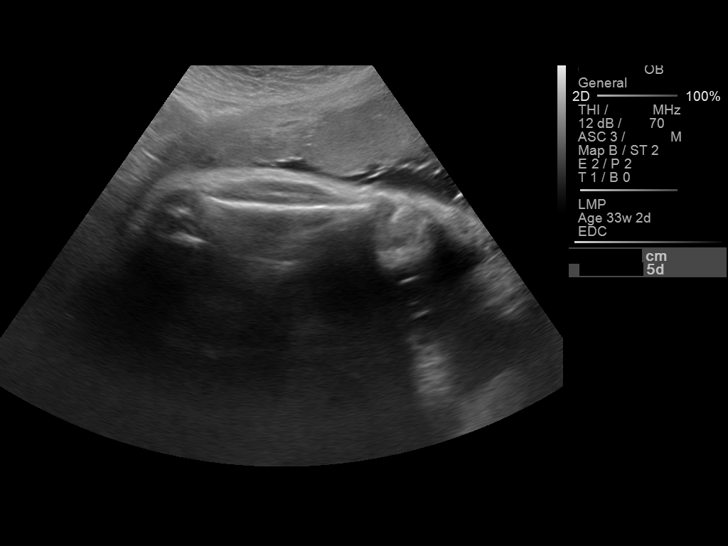
[im 25/36]
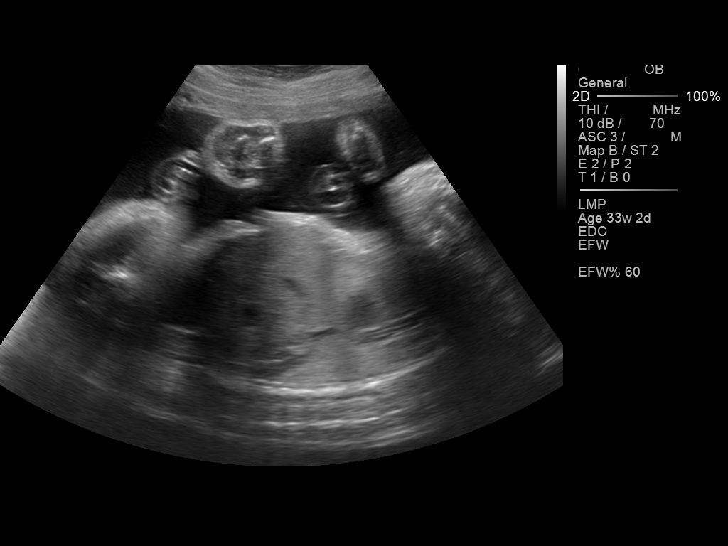
[im 28/36]
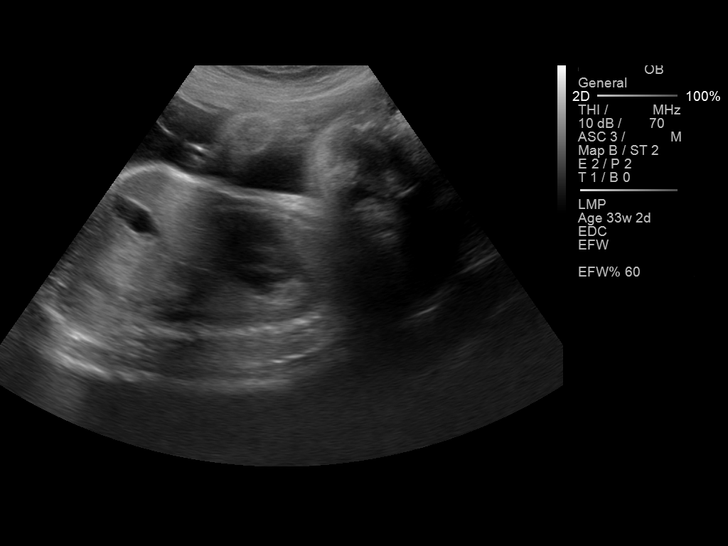
[im 30/36]
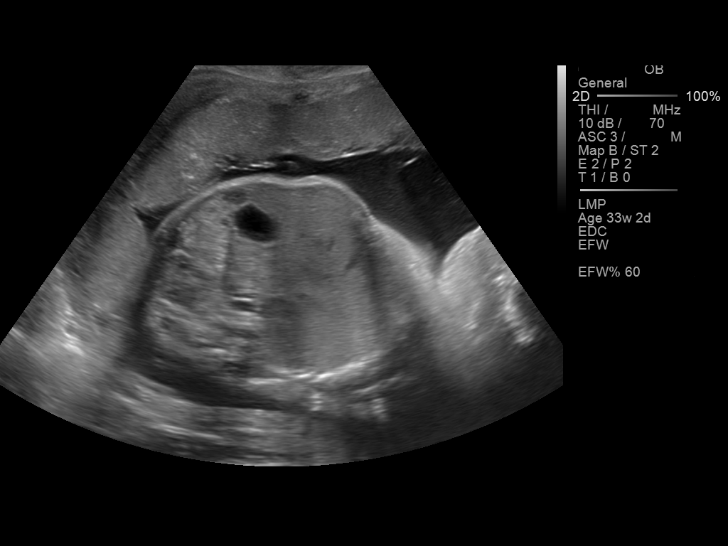
[im 33/36]
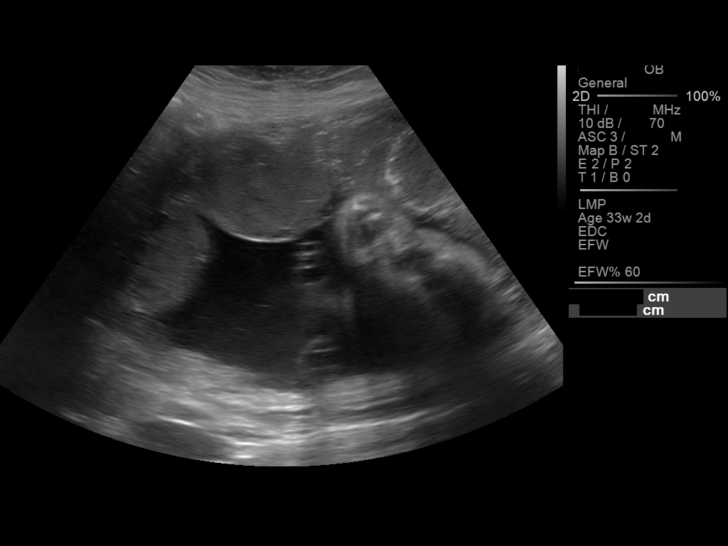
[im 36/36]
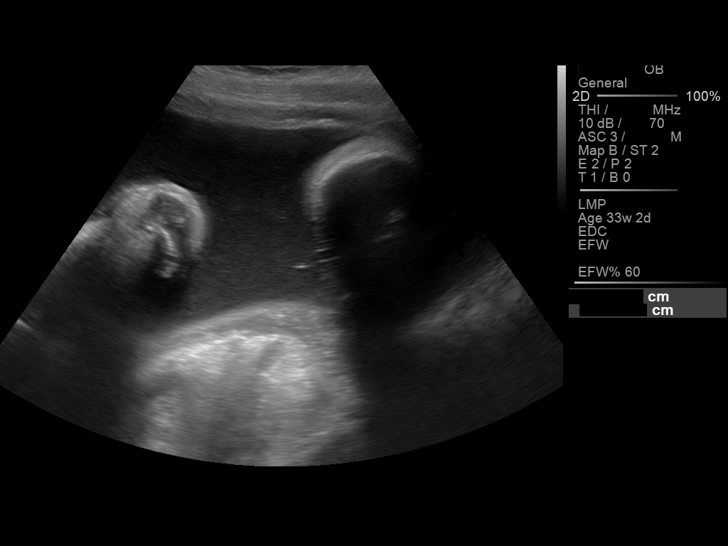

[14 of 28 positions shown; findings below may reference images not displayed]

Canned report from images found in remote index.

Refer to host system for actual result text.

## 2017-04-18 ENCOUNTER — Encounter (HOSPITAL_COMMUNITY): Payer: Self-pay | Admitting: *Deleted

## 2017-04-18 ENCOUNTER — Emergency Department (HOSPITAL_COMMUNITY): Payer: Medicaid Other

## 2017-04-18 ENCOUNTER — Emergency Department (HOSPITAL_COMMUNITY)
Admission: EM | Admit: 2017-04-18 | Discharge: 2017-04-18 | Disposition: A | Discharge status: Home / Self Care | Payer: Medicaid Other | Attending: Emergency Medicine | Admitting: Emergency Medicine

## 2017-04-18 DIAGNOSIS — F17210 Nicotine dependence, cigarettes, uncomplicated: Secondary | ICD-10-CM | POA: Insufficient documentation

## 2017-04-18 DIAGNOSIS — M25561 Pain in right knee: Secondary | ICD-10-CM | POA: Insufficient documentation

## 2017-04-18 MED ORDER — NAPROXEN 500 MG PO TABS: 500.0000 mg | ORAL_TABLET | Freq: Two times a day (BID) | ORAL | 0 refills | Status: DC

## 2017-04-18 MED ORDER — IBUPROFEN 400 MG PO TABS
600.0000 mg | ORAL_TABLET | Freq: Once | ORAL | Status: AC
  Administered 2017-04-18: 600 mg via ORAL
  Filled 2017-04-18: qty 1

## 2017-04-18 NOTE — Discharge Instructions (Signed)
You were seen in the emergency department for right knee pain. Your xray was negative for an acute fracture. Take Naproxen as prescribed as needed for pain-take with food to prevent GI upset. Call to schedule a follow up appointment with your primary care doctor within 1 week. Return to the ER if you experience fevers, chills, unexplained weight loss, dizziness, chest pain, shortness of breath, abdominal pain, nausea, vomiting, diarrhea, redness/swelling/warmth to area, increased pain, worsening symptoms, or any additional concerns.  Self-care for Knee Injury: Rest your knee. Rest helps decrease swelling and allows the injury to heal. You can do gentle range of motion (ROM) exercises as directed. This will prevent stiffness. Apply ice to your knee for 20 minutes four times a day. Use an ice pack, or put crushed ice in a plastic bag. Cover it with a towel; do not apply ice directly to skin. Ice helps prevent tissue damage and decreases swelling and pain. Apply compression to your knee as directed. You may need to wear an elastic bandage. This helps keep your injured knee from moving too much while it heals. You can loosen or tighten the elastic bandage to make it comfortable. It should be tight enough for you to feel support. It should not be so tight that it causes your toes to be numb or tingly. If you are wearing an elastic bandage, take it off and rewrap it at least once a day. Elevate your knee above the level of your heart as often as you can. This will help decrease swelling and pain. Prop your leg on pillows or blankets to keep it elevated comfortably. Do not put pillows directly behind your knee.

## 2017-04-18 NOTE — ED Provider Notes (Signed)
[facility] Emergency Department Provider Note  ED Clinical Impression   Acute pain of right knee  History   Chief Complaint Knee Pain   HPI  Patient is a 31 y.o. female with a PMH of bipolar disorder and schizophrenia who presents to ED for right knee pain, states initial onset approx 2 years ago after falling through porch, since then intermittently experiences pain to area, reports onset this morning, describes as dull, worse with palpation and bending leg, has not tried OTC meds. No recent trauma, fall, or additional known injury to area. Denies extremity numbness or tingling. No redness or warmth to leg. Denies fevers, chills, unexplained weight loss, dizziness, vision changes, CP, SOB, cough, abd pain, n/v/d,  extremity numbness or tingling, extremity weakness, or any additional concerns. No recent travel, prolonged immobilization, recent surgery, hx of malignancy, estrogen/hormone use, or hx of DVT/PE. No anticoag use.  Past Medical History:  Diagnosis Date  . Bipolar 1 disorder (HCC)   . Breast abscess   . Headache   . Low maternal weight gain   . Obesity   . Schizophrenia (HCC)   . Tobacco use in pregnancy     History reviewed. No pertinent surgical history.  Current Outpatient Rx  . Order #: [id number] Class: Print  . Order #: [id number] Class: Print  . Order #: [id number] Class: Print  . Order #: [id number] Class: Print    Allergies Nicoderm [nicotine]  Family History  Problem Relation Age of Onset  . Stroke Father   . Cancer Paternal Aunt     Social History Social History  Substance Use Topics  . Smoking status: Current Every Day Smoker    Packs/day: 0.50    Years: 15.00    Types: Cigarettes  . Smokeless tobacco: Never Used  . Alcohol use No     Comment: 2-3 40s a day for several years    Review of Systems  Constitutional: Negative for fever, chills, or unexplained weight loss. Eyes: Negative for visual changes. Cardiovascular: Negative for chest pain,  palpitations, or extremity swelling. Respiratory: Negative for shortness of breath or cough. Gastrointestinal: Negative for abdominal pain, nausea, vomiting, or diarrhea. Musculoskeletal: +right knee pain. Negative for back pain. Skin: Negative for rash. Neurological: Negative for headaches, dizziness, focal weakness, or numbness/tingling.  Physical Exam   VITAL SIGNS:   ED Triage Vitals  Enc Vitals Group     BP --      Pulse Rate 04/18/17 0819 74     Resp 04/18/17 0819 16     Temp 04/18/17 0819 98.1 F (36.7 C)     Temp Source 04/18/17 0819 Oral     SpO2 04/18/17 0819 97 %     Weight 04/18/17 0817 218 lb (98.9 kg)     Height 04/18/17 0817 5\' 6"  (1.676 m)     Head Circumference --      Peak Flow --      Pain Score 04/18/17 0817 10     Pain Loc --      Pain Edu? --      Excl. in GC? --     Constitutional: Alert and oriented. Well appearing and in no respiratory apparent distress. Eyes: PERRL, EOMI, Conjunctivae normal ENT      Head: Normocephalic and atraumatic.      Mouth/Throat: Mucous membranes are moist. Oropharynx without erythema or exudate. Normal voice, handling secretions normally.      Neck: Supple, no nuchal signs, full active ROM of neck.  Cardiovascular: Normal S1 S2, regular  rhythm, normal rate. Normal and symmetric distal pulses are present in all extremities. Respiratory: Breath sounds clear and equal bilaterally. No wheezes, rales, or rhonchi. Normal respiratory effort.  Gastrointestinal: Abdomen soft and nontender. No rebound or guarding.  Back: No midline tenderness, no stepoff.  Musculoskeletal: + R anterior knee mildly ttp, full active ROM; no sig effusion, no STS; skin intact without erythema/warmth/ecchymosis to area. No medial or lateral joint line tenderness. Negative anterior posterior drawer, no patellar tenderness, fibular head nontender, able to flex 90 degrees. Sharp and dull sensation grossly intact to bilateral LE, dorsalis pedis and posterior  tibial pulses 2+, cap refill < 2sec. No calf pain, erythema, or generalized swelling noted bilaterally. No acute signs in all other extremities.  Neurologic: Speech clear. Alert and appropriate, no gross focal neurologic deficits are appreciated. Gait steady with ambulation. Equal strength in all four extremities. Extremities neurovascularly intact.  Skin: Skin is warm, dry, and intact. No rash noted. Psychiatric: Mood and affect are normal. Speech and behavior are normal.  Labs   Labs Reviewed - No data to display  Radiology   DG Knee Complete 4 Views Right    (Results Pending)     ED Course, Assessment and Plan   Pt is a 31 y/o F, afebrile, who presents to ED for right knee pain, concern for strain. No evidence of neurovascular compromise. Doubt septic joint or osteomyelitis. Compartments soft, doubt compartment syndrome.  Doubt DVT at this time. Will plan to obtain xray for acute injury; if negative likely dc with symptomatic tx and PCP follow up; patient amenable to this plan.  9:10 AM Xray negative for acute osseous abnormality. Discussed results, discharge instructions, rx and safety, RICE, return precautions, and follow up. Pt verbalizes understanding using verbal teachback and agrees with plan, denies any additional concerns.   Previous chart, nursing notes, and vital signs reviewed.    Pertinent labs & imaging results that were available during my care of the patient were reviewed by me and considered in my medical decision making (see chart for details).     Samary Shatz, Hinton Dyer, NP 04/18/17 1604    Linwood Dibbles, MD 04/21/17 [phone]

## 2017-04-18 NOTE — ED Triage Notes (Signed)
PT states R knee pain for several months, that increased this week.  States fell through a porch several months ago.

## 2017-05-13 ENCOUNTER — Encounter (HOSPITAL_COMMUNITY): Payer: Self-pay | Admitting: Emergency Medicine

## 2017-05-13 ENCOUNTER — Emergency Department (HOSPITAL_COMMUNITY)
Admission: EM | Admit: 2017-05-13 | Discharge: 2017-05-13 | Disposition: A | Discharge status: Home / Self Care | Payer: Medicaid Other | Attending: Emergency Medicine | Admitting: Emergency Medicine

## 2017-05-13 DIAGNOSIS — Z79899 Other long term (current) drug therapy: Secondary | ICD-10-CM | POA: Insufficient documentation

## 2017-05-13 DIAGNOSIS — L02214 Cutaneous abscess of groin: Secondary | ICD-10-CM | POA: Insufficient documentation

## 2017-05-13 DIAGNOSIS — F17210 Nicotine dependence, cigarettes, uncomplicated: Secondary | ICD-10-CM | POA: Insufficient documentation

## 2017-05-13 DIAGNOSIS — Z791 Long term (current) use of non-steroidal anti-inflammatories (NSAID): Secondary | ICD-10-CM | POA: Insufficient documentation

## 2017-05-13 DIAGNOSIS — L0291 Cutaneous abscess, unspecified: Secondary | ICD-10-CM

## 2017-05-13 MED ORDER — LIDOCAINE HCL (PF) 1 % IJ SOLN
5.0000 mL | Freq: Once | INTRAMUSCULAR | Status: DC
  Filled 2017-05-13: qty 5

## 2017-05-13 NOTE — Discharge Instructions (Signed)
Do warm compresses at home.  Keep clean with soap and warm water. Follow-up with your primary care doctor. Return to the ED for new or worsening symptoms.

## 2017-05-13 NOTE — ED Notes (Signed)
Lido  And I and D cart at bedside

## 2017-05-13 NOTE — ED Triage Notes (Signed)
Pt states "i have a boil in my groin area that last time they had to numb it and cut it open." pt states the abscess is outside her vagina. Pt in NAD, no fevers.

## 2017-05-13 NOTE — ED Provider Notes (Signed)
[facility]-EMERGENCY DEPT Provider Note   CSN: [id number] Arrival date & time: 05/13/17  4098     History   Chief Complaint Chief Complaint  Patient presents with  . Abscess    HPI Kathryn Livingston is a 31 y.o. female.  The history is provided by the patient and medical records.  Abscess     31 y.o. F with hx of bipolar disorder, headaches, obesity, schizophrenia, presenting to the ED for abscess of right groin.  States she noticed this 3 days ago, has become large since then.  No drainage.  No fever or chills.  Hx of same requiring I&D.  No intervention tried PTA.  Past Medical History:  Diagnosis Date  . Bipolar 1 disorder (HCC)   . Breast abscess   . Headache   . Low maternal weight gain   . Obesity   . Schizophrenia (HCC)   . Tobacco use in pregnancy     Patient Active Problem List   Diagnosis Date Noted  . Severe major depression, single episode, with psychotic features (HCC) 04/11/2016  . Alcohol abuse 04/11/2016  . Major depressive disorder, single episode, severe w psychotic behavior (HCC) 04/11/2016  . Spontaneous vaginal delivery 04/20/2015  . Polyhydramnios 04/19/2015  . Indication for care in labor or delivery 03/30/2015  . Labor and delivery, indication for care 03/20/2015  . Polyhydramnios in third trimester 03/17/2015  . Continuous tobacco abuse 02/26/2015  . Marijuana use 02/26/2015  . Low weight gain in pregnancy 02/26/2015  . Headache in pregnancy 02/26/2015  . Abnormal quad screen 02/26/2015  . Anemia in pregnancy 02/26/2015    History reviewed. No pertinent surgical history.  OB History    Gravida Para Term Preterm AB Living   4 4 4  0 0 4   SAB TAB Ectopic Multiple Live Births   0 0 0   4       Home Medications    Prior to Admission medications   Medication Sig Start Date End Date Taking? Authorizing Provider  hydrOXYzine (ATARAX/VISTARIL) 25 MG tablet Take 1 tablet (25 mg total) by mouth every 6 (six) hours as needed for anxiety.  04/13/16   Adonis Brook, NP  naproxen (NAPROSYN) 500 MG tablet Take 1 tablet (500 mg total) by mouth 2 (two) times daily. Take with food 04/18/17   Wojeck, Hinton Dyer, NP  nicotine polacrilex (NICORETTE) 2 MG gum Take 1 each (2 mg total) by mouth as needed for smoking cessation. 04/13/16   Adonis Brook, NP  sertraline (ZOLOFT) 50 MG tablet Take 1 tablet (50 mg total) by mouth daily. 04/13/16   Adonis Brook, NP  traZODone (DESYREL) 50 MG tablet Take 1 tablet (50 mg total) by mouth at bedtime as needed for sleep. 04/13/16   Adonis Brook, NP    Family History Family History  Problem Relation Age of Onset  . Stroke Father   . Cancer Paternal Aunt     Social History Social History  Substance Use Topics  . Smoking status: Current Every Day Smoker    Packs/day: 0.50    Years: 15.00    Types: Cigarettes  . Smokeless tobacco: Never Used  . Alcohol use No     Comment: 2-3 40s a day for several years     Allergies   Nicoderm [nicotine]   Review of Systems Review of Systems  Skin:       abscess  All other systems reviewed and are negative.    Physical Exam Updated Vital Signs BP  114/75   Pulse 95   Temp 98.1 F (36.7 C) (Oral)   Resp 16   Ht 5\' 6"  (1.676 m)   Wt 99.8 kg (220 lb)   LMP 05/06/2017   SpO2 100%   BMI 35.51 kg/m   Physical Exam  Constitutional: She is oriented to person, place, and time. She appears well-developed and well-nourished.  HENT:  Head: Normocephalic and atraumatic.  Mouth/Throat: Oropharynx is clear and moist.  Eyes: Pupils are equal, round, and reactive to light. Conjunctivae and EOM are normal.  Neck: Normal range of motion.  Cardiovascular: Normal rate, regular rhythm and normal heart sounds.   Pulmonary/Chest: Effort normal and breath sounds normal. No respiratory distress. She has no wheezes.  Abdominal: Soft. Bowel sounds are normal. There is no tenderness. There is no rebound.  Musculoskeletal: Normal range of motion.  Small 1cm  abscess to right medial thigh; central fluctuance noted, no drainage, no surrounding erythema or induration  Neurological: She is alert and oriented to person, place, and time.  Skin: Skin is warm and dry.  Psychiatric: She has a normal mood and affect.  Nursing note and vitals reviewed.    ED Treatments / Results  Labs (all labs ordered are listed, but only abnormal results are displayed) Labs Reviewed - No data to display  EKG  EKG Interpretation None       Radiology No results found.  Procedures Procedures (including critical care time)  INCISION AND DRAINAGE Performed by: Garlon Hatchet Consent: Verbal consent obtained. Risks and benefits: risks, benefits and alternatives were discussed Type: abscess  Body area: right medial thigh  Anesthesia: local infiltration  Incision was made with a scalpel.  Local anesthetic: lidocaine 1% without epinephrine  Anesthetic total: 3 ml  Complexity: complex Blunt dissection to break up loculations  Drainage: purulent  Drainage amount: moderate  Packing material: none  Patient tolerance: Patient tolerated the procedure well with no immediate complications.    Medications Ordered in ED Medications  lidocaine (PF) (XYLOCAINE) 1 % injection 5 mL (not administered)     Initial Impression / Assessment and Plan / ED Course  I have reviewed the triage vital signs and the nursing notes.  Pertinent labs & imaging results that were available during my care of the patient were reviewed by me and considered in my medical decision making (see chart for details).  31 year old female who with abscess of right medial thigh. Appears uncomplicated without signs of superimposed infection or cellulitis. I&D performed here, moderate amount of purulent drainage. Patient feeling better afterwards. Does home wound care, warm compresses. Do not feel she needs antibiotics at this time. Will have her follow-up with her primary care doctor  with any ongoing issues.  Discussed plan with patient, she acknowledged understanding and agreed with plan of care.  Return precautions given for new or worsening symptoms.  Final Clinical Impressions(s) / ED Diagnoses   Final diagnoses:  Abscess    New Prescriptions New Prescriptions   No medications on file     Garlon Hatchet, PA-C 05/13/17 1123    Little, Ambrose Finland, MD 05/14/17 2106

## 2017-08-12 ENCOUNTER — Emergency Department (HOSPITAL_COMMUNITY): Payer: Medicaid Other

## 2017-08-12 ENCOUNTER — Other Ambulatory Visit: Payer: Self-pay

## 2017-08-12 ENCOUNTER — Encounter (HOSPITAL_COMMUNITY): Payer: Self-pay | Admitting: Emergency Medicine

## 2017-08-12 ENCOUNTER — Encounter (HOSPITAL_COMMUNITY): Payer: Self-pay

## 2017-08-12 ENCOUNTER — Emergency Department (HOSPITAL_COMMUNITY)
Admission: EM | Admit: 2017-08-12 | Discharge: 2017-08-12 | Disposition: A | Discharge status: Home / Self Care | Payer: Medicaid Other | Attending: Emergency Medicine | Admitting: Emergency Medicine

## 2017-08-12 DIAGNOSIS — Y92410 Unspecified street and highway as the place of occurrence of the external cause: Secondary | ICD-10-CM | POA: Diagnosis not present

## 2017-08-12 DIAGNOSIS — S8991XA Unspecified injury of right lower leg, initial encounter: Secondary | ICD-10-CM | POA: Diagnosis present

## 2017-08-12 DIAGNOSIS — Y939 Activity, unspecified: Secondary | ICD-10-CM | POA: Diagnosis not present

## 2017-08-12 DIAGNOSIS — S8001XA Contusion of right knee, initial encounter: Secondary | ICD-10-CM | POA: Diagnosis not present

## 2017-08-12 DIAGNOSIS — T07XXXA Unspecified multiple injuries, initial encounter: Secondary | ICD-10-CM

## 2017-08-12 DIAGNOSIS — M25511 Pain in right shoulder: Secondary | ICD-10-CM | POA: Insufficient documentation

## 2017-08-12 DIAGNOSIS — Y999 Unspecified external cause status: Secondary | ICD-10-CM | POA: Diagnosis not present

## 2017-08-12 DIAGNOSIS — S40011A Contusion of right shoulder, initial encounter: Secondary | ICD-10-CM | POA: Insufficient documentation

## 2017-08-12 DIAGNOSIS — M7918 Myalgia, other site: Secondary | ICD-10-CM | POA: Insufficient documentation

## 2017-08-12 DIAGNOSIS — F17210 Nicotine dependence, cigarettes, uncomplicated: Secondary | ICD-10-CM

## 2017-08-12 DIAGNOSIS — Z79899 Other long term (current) drug therapy: Secondary | ICD-10-CM

## 2017-08-12 DIAGNOSIS — Z791 Long term (current) use of non-steroidal anti-inflammatories (NSAID): Secondary | ICD-10-CM

## 2017-08-12 DIAGNOSIS — M542 Cervicalgia: Secondary | ICD-10-CM
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
## 2017-08-12 NOTE — ED Notes (Signed)
Patient remains uncooperative and yelling/cursing at staff. [employer] officer with patient.

## 2017-08-12 NOTE — Discharge Instructions (Signed)
Motrin or Tylenol for pain

## 2017-08-12 NOTE — ED Notes (Signed)
Patient continues to curse staff and yell/uncooperative with triage. Patient states she needs to void but refuses to use bedpan or have staff assist her with the bathroom. Dr. Fayrene FearingJames at bedside attempting to examine patient. Patient has been asked to quit cursing staff, but continues to remain verbally abusive.

## 2017-08-12 NOTE — ED Notes (Signed)
Patient given paper scrubs and an ice pack for her right shoulder-assisted to bathroom-patient left in custody of [employer].
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
## 2017-08-12 NOTE — ED Notes (Signed)
Patient also uncooperative with Dr. Craig GuessJames/yelling and verbally abusive
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
## 2017-08-12 NOTE — ED Notes (Signed)
Security and [employer] remain at bedside due to patient's behavior.
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
## 2017-08-12 NOTE — ED Notes (Signed)
C-collar applied in triage.

## 2017-08-12 NOTE — ED Notes (Signed)
Patient calling out "I need to fucking pee". RN and Clinical research associatewriter tried to help patient to bathroom. Asked patient if she could stand/walk and patient responded with "I'm tried of you fucking asking me questions I already fucking told you. I ain't answering no more fucking questions". Patient is continually yelling "fuck this and fuck you" at nursing staff, [employer] and security guards.

## 2017-08-13 ENCOUNTER — Emergency Department (HOSPITAL_COMMUNITY): Payer: Medicaid Other

## 2017-08-13 ENCOUNTER — Emergency Department (HOSPITAL_COMMUNITY)
Admission: EM | Admit: 2017-08-13 | Discharge: 2017-08-13 | Disposition: A | Discharge status: Home / Self Care | Payer: Medicaid Other | Source: Home / Self Care | Attending: Emergency Medicine | Admitting: Emergency Medicine

## 2017-08-13 DIAGNOSIS — M7918 Myalgia, other site: Secondary | ICD-10-CM

## 2017-08-13 MED ORDER — IBUPROFEN 600 MG PO TABS: 600.0000 mg | ORAL_TABLET | Freq: Four times a day (QID) | ORAL | 0 refills | Status: DC | PRN

## 2017-08-13 MED ORDER — OXYCODONE-ACETAMINOPHEN 5-325 MG PO TABS
1.0000 | ORAL_TABLET | Freq: Once | ORAL | Status: AC
  Administered 2017-08-13: 1 via ORAL
  Filled 2017-08-13: qty 1

## 2017-08-13 MED ORDER — CYCLOBENZAPRINE HCL 10 MG PO TABS: 10.0000 mg | ORAL_TABLET | Freq: Two times a day (BID) | ORAL | 0 refills | Status: DC | PRN

## 2017-08-13 NOTE — ED Notes (Signed)
Patient transported to X-ray 
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
## 2018-09-02 ENCOUNTER — Emergency Department (HOSPITAL_COMMUNITY): Payer: Medicaid Other

## 2018-09-02 ENCOUNTER — Encounter (HOSPITAL_COMMUNITY): Payer: Self-pay

## 2018-09-02 ENCOUNTER — Emergency Department (HOSPITAL_COMMUNITY)
Admission: EM | Admit: 2018-09-02 | Discharge: 2018-09-02 | Disposition: A | Discharge status: Home / Self Care | Payer: Medicaid Other | Attending: Emergency Medicine | Admitting: Emergency Medicine

## 2018-09-02 DIAGNOSIS — Z23 Encounter for immunization: Secondary | ICD-10-CM | POA: Insufficient documentation

## 2018-09-02 DIAGNOSIS — Y999 Unspecified external cause status: Secondary | ICD-10-CM | POA: Diagnosis not present

## 2018-09-02 DIAGNOSIS — Y929 Unspecified place or not applicable: Secondary | ICD-10-CM | POA: Diagnosis not present

## 2018-09-02 DIAGNOSIS — Y939 Activity, unspecified: Secondary | ICD-10-CM | POA: Insufficient documentation

## 2018-09-02 DIAGNOSIS — Z79899 Other long term (current) drug therapy: Secondary | ICD-10-CM | POA: Diagnosis not present

## 2018-09-02 DIAGNOSIS — S0990XA Unspecified injury of head, initial encounter: Secondary | ICD-10-CM | POA: Diagnosis present

## 2018-09-02 DIAGNOSIS — S025XXA Fracture of tooth (traumatic), initial encounter for closed fracture: Secondary | ICD-10-CM | POA: Insufficient documentation

## 2018-09-02 DIAGNOSIS — S01412A Laceration without foreign body of left cheek and temporomandibular area, initial encounter: Secondary | ICD-10-CM | POA: Diagnosis not present

## 2018-09-02 DIAGNOSIS — F17210 Nicotine dependence, cigarettes, uncomplicated: Secondary | ICD-10-CM | POA: Insufficient documentation

## 2018-09-02 DIAGNOSIS — Z3491 Encounter for supervision of normal pregnancy, unspecified, first trimester: Secondary | ICD-10-CM

## 2018-09-02 LAB — I-STAT BETA HCG BLOOD, ED (MC, WL, AP ONLY): HCG, QUANTITATIVE: 181.8 m[IU]/mL — AB (ref <5)

## 2018-09-02 LAB — HCG, QUANTITATIVE, PREGNANCY: HCG, BETA CHAIN, QUANT, S: 192 m[IU]/mL — AB (ref <5)

## 2018-09-02 MED ORDER — MAGIC MOUTHWASH
10.0000 mL | Freq: Once | ORAL | Status: DC
  Filled 2018-09-02: qty 10

## 2018-09-02 MED ORDER — AMOXICILLIN 500 MG PO CAPS
500.0000 mg | ORAL_CAPSULE | Freq: Once | ORAL | Status: AC
  Administered 2018-09-02: 500 mg via ORAL
  Filled 2018-09-02: qty 1

## 2018-09-02 MED ORDER — AMOXICILLIN 500 MG PO CAPS: 1000.0000 mg | ORAL_CAPSULE | Freq: Two times a day (BID) | ORAL | 0 refills | Status: DC

## 2018-09-02 MED ORDER — PRENATAL COMPLETE 14-0.4 MG PO TABS: 1.0000 | ORAL_TABLET | Freq: Every day | ORAL | 0 refills | Status: DC

## 2018-09-02 MED ORDER — TETANUS-DIPHTH-ACELL PERTUSSIS 5-2.5-18.5 LF-MCG/0.5 IM SUSP
0.5000 mL | Freq: Once | INTRAMUSCULAR | Status: AC
  Administered 2018-09-02: 0.5 mL via INTRAMUSCULAR
  Filled 2018-09-02: qty 0.5

## 2018-09-02 MED ORDER — LIDOCAINE-EPINEPHRINE (PF) 2 %-1:200000 IJ SOLN
10.0000 mL | Freq: Once | INTRAMUSCULAR | Status: AC
  Administered 2018-09-02: 10 mL
  Filled 2018-09-02: qty 10

## 2018-09-02 NOTE — Discharge Instructions (Signed)
You will need t see a dentist regarding your teeth.  Your stitches will dissolve - you do not need to return to have them removed.  Apply ice as needed.  Take ibuprofen or naproxen as needed for pain. If you need additional pain relief, take acetaminophen in addition to ibuprofen or naproxen.  Your pregnancy test was positive, you need to make arrangements for prenatal care.

## 2018-09-02 NOTE — ED Notes (Signed)
Declined W/C at D/C and was escorted to lobby by RN. 

## 2018-09-02 NOTE — ED Provider Notes (Signed)
LACERATION REPAIR Performed by: Antony MaduraKelly Hanford Lust Authorized by: Antony MaduraKelly Kamdyn Covel Consent: Verbal consent obtained. Risks and benefits: risks, benefits and alternatives were discussed Consent given by: patient Patient identity confirmed: provided demographic data Prepped and Draped in normal sterile fashion Wound explored  Laceration Location: L buccal mucosa  Laceration Length: 1cm  No Foreign Bodies seen or palpated  Anesthesia: local infiltration  Local anesthetic: lidocaine 1% with epinephrine  Anesthetic total: 3 ml  Irrigation method: syringe Amount of cleaning: standard  Skin closure: 5-0 chromic  Number of sutures: 1  Technique: subcutaneous  Patient tolerance: Patient tolerated the procedure well with no immediate complications.   LACERATION REPAIR Performed by: Antony MaduraKelly Darren Nodal Authorized by: Antony MaduraKelly Olevia Westervelt Consent: Verbal consent obtained. Risks and benefits: risks, benefits and alternatives were discussed Consent given by: patient Patient identity confirmed: provided demographic data Prepped and Draped in normal sterile fashion Wound explored  Laceration Location: L cheek  Laceration Length: 1.5cm  No Foreign Bodies seen or palpated  Anesthesia: local infiltration  Local anesthetic: lidocaine 1% with epinephrine  Anesthetic total: 3 ml  Irrigation method: syringe Amount of cleaning: standard  Skin closure: 5-0 vicryl  Number of sutures: 3  Technique: simple interrupted  Patient tolerance: Patient tolerated the procedure well with no immediate complications.   Antony MaduraHumes, Josecarlos Harriott, PA-C 09/02/18 0547    Dione BoozeGlick, David, MD 09/02/18 [phone]
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
## 2018-09-02 NOTE — ED Notes (Signed)
Patient transported to CT 
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
## 2018-09-04 ENCOUNTER — Emergency Department (HOSPITAL_COMMUNITY)
Admission: EM | Admit: 2018-09-04 | Discharge: 2018-09-04 | Disposition: A | Discharge status: Home / Self Care | Payer: Medicaid Other | Attending: Emergency Medicine | Admitting: Emergency Medicine

## 2018-09-04 ENCOUNTER — Emergency Department (HOSPITAL_COMMUNITY): Payer: Medicaid Other

## 2018-09-04 ENCOUNTER — Encounter (HOSPITAL_COMMUNITY): Payer: Self-pay | Admitting: *Deleted

## 2018-09-04 DIAGNOSIS — F17210 Nicotine dependence, cigarettes, uncomplicated: Secondary | ICD-10-CM | POA: Diagnosis not present

## 2018-09-04 DIAGNOSIS — O200 Threatened abortion: Secondary | ICD-10-CM | POA: Diagnosis not present

## 2018-09-04 DIAGNOSIS — O9989 Other specified diseases and conditions complicating pregnancy, childbirth and the puerperium: Secondary | ICD-10-CM | POA: Diagnosis present

## 2018-09-04 DIAGNOSIS — A5901 Trichomonal vulvovaginitis: Secondary | ICD-10-CM

## 2018-09-04 DIAGNOSIS — Z79899 Other long term (current) drug therapy: Secondary | ICD-10-CM | POA: Insufficient documentation

## 2018-09-04 LAB — CBC WITH DIFFERENTIAL/PLATELET
Abs Immature Granulocytes: 0.02 10*3/uL (ref 0.00–0.07)
BASOS ABS: 0.1 10*3/uL (ref 0.0–0.1)
Basophils Relative: 1 %
Eosinophils Absolute: 0.1 10*3/uL (ref 0.0–0.5)
Eosinophils Relative: 2 %
HCT: 31.8 % — ABNORMAL LOW (ref 36.0–46.0)
Hemoglobin: 10.1 g/dL — ABNORMAL LOW (ref 12.0–15.0)
Immature Granulocytes: 0 %
Lymphocytes Relative: 48 %
Lymphs Abs: 3.6 10*3/uL (ref 0.7–4.0)
MCH: 30.3 pg (ref 26.0–34.0)
MCHC: 31.8 g/dL (ref 30.0–36.0)
MCV: 95.5 fL (ref 80.0–100.0)
Monocytes Absolute: 0.5 10*3/uL (ref 0.1–1.0)
Monocytes Relative: 6 %
Neutro Abs: 3.2 10*3/uL (ref 1.7–7.7)
Neutrophils Relative %: 43 %
Platelets: 302 10*3/uL (ref 150–400)
RBC: 3.33 MIL/uL — ABNORMAL LOW (ref 3.87–5.11)
RDW: 13.2 % (ref 11.5–15.5)
WBC: 7.6 10*3/uL (ref 4.0–10.5)
nRBC: 0 % (ref 0.0–0.2)

## 2018-09-04 LAB — WET PREP, GENITAL
Clue Cells Wet Prep HPF POC: NONE SEEN
SPERM: NONE SEEN
Yeast Wet Prep HPF POC: NONE SEEN

## 2018-09-04 LAB — BASIC METABOLIC PANEL
Anion gap: 10 (ref 5–15)
BUN: 8 mg/dL (ref 6–20)
CHLORIDE: 108 mmol/L (ref 98–111)
CO2: 24 mmol/L (ref 22–32)
Calcium: 8.8 mg/dL — ABNORMAL LOW (ref 8.9–10.3)
Creatinine, Ser: 0.82 mg/dL (ref 0.44–1.00)
GFR calc Af Amer: >60 mL/min (ref >60)
GFR calc non Af Amer: >60 mL/min (ref >60)
Glucose, Bld: 84 mg/dL (ref 70–99)
Potassium: 3.9 mmol/L (ref 3.5–5.1)
Sodium: 142 mmol/L (ref 135–145)

## 2018-09-04 LAB — HCG, QUANTITATIVE, PREGNANCY: hCG, Beta Chain, Quant, S: 70 m[IU]/mL — ABNORMAL HIGH (ref <5)

## 2018-09-04 MED ORDER — METRONIDAZOLE 500 MG PO TABS: 500.0000 mg | ORAL_TABLET | Freq: Two times a day (BID) | ORAL | 0 refills | Status: DC

## 2018-09-04 NOTE — ED Notes (Signed)
Patient transported to Ultrasound 
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
## 2018-09-04 NOTE — Discharge Instructions (Signed)
It is important that you have your hormone level rechecked in 2 days to continue to trend it down back to normal. Call the outpatient women's clinic to see if they can see you for that lab check.  [facility] have [facility] lab recheck[facility] as well. [facility] if you experience severely worsening pain, worsening bleeding, fever, or concerning symptoms. Take the antibiotics, metronidazole/Flagyl, as prescribed until it is gone.  It is important to inform all of your sexual partners so that they can receive treatment as well.  You other STD tests pending.  The hospital will contact you if any are positive.  Avoid sexual contact until you know your results.

## 2018-09-04 NOTE — ED Notes (Signed)
Pelvic exam done by Jordan - PA and Kathia Covington - EMT assisted. 
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
## 2018-09-04 NOTE — ED Notes (Signed)
Patient verbalizes understanding of discharge instructions. Opportunity for questioning and answers were provided. 

## 2018-09-05 ENCOUNTER — Encounter: Payer: Self-pay | Admitting: *Deleted

## 2018-09-05 LAB — HIV ANTIBODY (ROUTINE TESTING W REFLEX): HIV Screen 4th Generation wRfx: NONREACTIVE

## 2018-09-05 LAB — RPR: RPR: NONREACTIVE

## 2018-09-05 NOTE — Progress Notes (Signed)
[facility] and said she needed to come in for a beta following an ED visit but was not sure if it was STAT or not.  Reviewed case with Luna KitchensKathryn Kooistra CNM and she just needs a follow up in a week for another beta next Monday, not stat and she needs to be seen by a provider in 2 weeks.  Antoinette made aware.

## 2018-09-06 LAB — GC/CHLAMYDIA PROBE AMP (~~LOC~~) NOT AT ARMC
Chlamydia: NEGATIVE
NEISSERIA GONORRHEA: NEGATIVE

## 2018-09-07 ENCOUNTER — Other Ambulatory Visit (INDEPENDENT_AMBULATORY_CARE_PROVIDER_SITE_OTHER): Payer: Medicaid Other

## 2018-09-07 DIAGNOSIS — O3680X0 Pregnancy with inconclusive fetal viability, not applicable or unspecified: Secondary | ICD-10-CM

## 2018-09-07 LAB — HCG, QUANTITATIVE, PREGNANCY: hCG, Beta Chain, Quant, S: 27 m[IU]/mL — ABNORMAL HIGH (ref <5)

## 2018-09-07 NOTE — Progress Notes (Signed)
Agree with A & P. 

## 2018-09-07 NOTE — Progress Notes (Signed)
Pt here today for STAT Beta Lab.  Pt reports having some spotting and no pain.  Pt advised that she will be here for at least two hours for results and f/u.    Notified Dr. Alysia PennaErvin pt's results.  Provider recommendation for pt to come back in two weeks for another beta and provider visit.  Pt informed of provider's recommendation. Pt agreed with no further questions.

## 2018-09-07 NOTE — Progress Notes (Signed)
Pt was informed during visit for f/u.

## 2018-09-09 ENCOUNTER — Other Ambulatory Visit: Payer: Self-pay

## 2018-09-09 ENCOUNTER — Encounter (HOSPITAL_COMMUNITY): Payer: Self-pay

## 2018-09-09 ENCOUNTER — Emergency Department (HOSPITAL_COMMUNITY)
Admission: EM | Admit: 2018-09-09 | Discharge: 2018-09-09 | Disposition: A | Discharge status: Home / Self Care | Payer: Medicaid Other | Attending: Emergency Medicine | Admitting: Emergency Medicine

## 2018-09-09 DIAGNOSIS — Z79899 Other long term (current) drug therapy: Secondary | ICD-10-CM | POA: Insufficient documentation

## 2018-09-09 DIAGNOSIS — B349 Viral infection, unspecified: Secondary | ICD-10-CM | POA: Insufficient documentation

## 2018-09-09 DIAGNOSIS — F17210 Nicotine dependence, cigarettes, uncomplicated: Secondary | ICD-10-CM | POA: Diagnosis not present

## 2018-09-09 DIAGNOSIS — R110 Nausea: Secondary | ICD-10-CM | POA: Diagnosis present

## 2018-09-09 LAB — CBC
HCT: 34.5 % — ABNORMAL LOW (ref 36.0–46.0)
Hemoglobin: 10.9 g/dL — ABNORMAL LOW (ref 12.0–15.0)
MCH: 30.9 pg (ref 26.0–34.0)
MCHC: 31.6 g/dL (ref 30.0–36.0)
MCV: 97.7 fL (ref 80.0–100.0)
Platelets: 346 10*3/uL (ref 150–400)
RBC: 3.53 MIL/uL — ABNORMAL LOW (ref 3.87–5.11)
RDW: 13.2 % (ref 11.5–15.5)
WBC: 11.6 10*3/uL — ABNORMAL HIGH (ref 4.0–10.5)
nRBC: 0 % (ref 0.0–0.2)

## 2018-09-09 MED ORDER — ONDANSETRON 8 MG PO TBDP
8.0000 mg | ORAL_TABLET | Freq: Once | ORAL | Status: AC
  Administered 2018-09-09: 8 mg via ORAL
  Filled 2018-09-09: qty 1

## 2018-09-09 MED ORDER — ACETAMINOPHEN 325 MG PO TABS
650.0000 mg | ORAL_TABLET | Freq: Once | ORAL | Status: AC
  Administered 2018-09-09: 650 mg via ORAL
  Filled 2018-09-09: qty 2

## 2018-09-09 MED ORDER — ONDANSETRON 8 MG PO TBDP: 8.0000 mg | ORAL_TABLET | Freq: Three times a day (TID) | ORAL | 0 refills | Status: DC | PRN

## 2018-09-09 NOTE — ED Notes (Signed)
Pt ask for water and was told that she couldn't have any until the doctor saw her and she stood up and got water from the sink
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
## 2018-09-09 NOTE — ED Provider Notes (Signed)
[facility]-EMERGENCY DEPT Provider Note   CSN: [id number] Arrival date & time: 09/09/18  0910     History   Chief Complaint Chief Complaint  Patient presents with  . Anxiety    HPI Kathryn Livingston is a 32 y.o. female.  HPI Patient is a 32 year old female who reports nausea and possible diarrhea.  She states she feels off and somewhat weak today.  She recently underwent a miscarriage.  She reports no longer having any vaginal bleeding.  No syncope.  Mild decreased oral intake.  Denies fevers and chills.  No chest pain.  No shortness of breath.  Denies abdominal pain.  EMS was initially called for shortness of breath but patient reports this was secondary to a mild panic attack.  She feels much better at this time.   Past Medical History:  Diagnosis Date  . Bipolar 1 disorder (HCC)   . Breast abscess   . Headache   . Low maternal weight gain   . Obesity   . Schizophrenia (HCC)   . Tobacco use in pregnancy     Patient Active Problem List   Diagnosis Date Noted  . Severe major depression, single episode, with psychotic features (HCC) 04/11/2016  . Alcohol abuse 04/11/2016  . Major depressive disorder, single episode, severe w psychotic behavior (HCC) 04/11/2016  . Spontaneous vaginal delivery 04/20/2015  . Polyhydramnios 04/19/2015  . Indication for care in labor or delivery 03/30/2015  . Labor and delivery, indication for care 03/20/2015  . Polyhydramnios in third trimester 03/17/2015  . Continuous tobacco abuse 02/26/2015  . Marijuana use 02/26/2015  . Low weight gain in pregnancy 02/26/2015  . Headache in pregnancy 02/26/2015  . Abnormal quad screen 02/26/2015  . Anemia in pregnancy 02/26/2015    History reviewed. No pertinent surgical history.   OB History    Gravida  5   Para  4   Term  4   Preterm  0   AB  0   Living  4     SAB  0   TAB  0   Ectopic  0   Multiple      Live Births  4            Home  Medications    Prior to Admission medications   Medication Sig Start Date End Date Taking? Authorizing Provider  amoxicillin (AMOXIL) 500 MG capsule Take 2 capsules (1,000 mg total) by mouth 2 (two) times daily. 09/02/18   Dione Booze, MD  ibuprofen (ADVIL,MOTRIN) 600 MG tablet Take 1 tablet (600 mg total) every 6 (six) hours as needed by mouth. 08/13/17   Upstill, Melvenia Beam, PA-C  metroNIDAZOLE (FLAGYL) 500 MG tablet Take 1 tablet (500 mg total) by mouth 2 (two) times daily. 09/04/18   Robinson, [place] N, PA-C  naproxen (NAPROSYN) 500 MG tablet Take 1 tablet (500 mg total) by mouth 2 (two) times daily. Take with food 04/18/17   Wojeck, Hinton Dyer, NP  nicotine polacrilex (NICORETTE) 2 MG gum Take 1 each (2 mg total) by mouth as needed for smoking cessation. 04/13/16   Adonis Brook, NP  ondansetron (ZOFRAN ODT) 8 MG disintegrating tablet Take 1 tablet (8 mg total) by mouth every 8 (eight) hours as needed for nausea or vomiting. 09/09/18   Azalia Bilis, MD  Prenatal Vit-Fe Fumarate-FA (PRENATAL COMPLETE) 14-0.4 MG TABS Take 1 tablet by mouth daily. 09/02/18   Dione Booze, MD  sertraline (ZOLOFT) 50 MG tablet Take 1 tablet (50 mg  total) by mouth daily. 04/13/16   Adonis BrookAgustin, Sheila, NP  traZODone (DESYREL) 50 MG tablet Take 1 tablet (50 mg total) by mouth at bedtime as needed for sleep. 04/13/16   Adonis BrookAgustin, Sheila, NP    Family History Family History  Problem Relation Age of Onset  . Stroke Father   . Cancer Paternal Aunt     Social History Social History   Tobacco Use  . Smoking status: Current Every Day Smoker    Packs/day: 0.25    Years: 15.00    Pack years: 3.75    Types: Cigarettes  . Smokeless tobacco: Never Used  Substance Use Topics  . Alcohol use: Yes    Comment: 2-3 40s a day for several years  . Drug use: Yes    Frequency: 7.0 times per week    Types: Marijuana     Allergies   Nicoderm [nicotine]   Review of Systems Review of Systems  All other systems reviewed and are  negative.    Physical Exam Updated Vital Signs BP 117/82 (BP Location: Right Arm)   Pulse 75   Temp 97.8 F (36.6 C) (Oral)   Ht 5\' 6"  (1.676 m)   Wt 96.6 kg   LMP  (LMP Unknown) Comment: Patient sttes she had a miscarriage.  SpO2 100%   BMI 34.38 kg/m   Physical Exam Vitals signs and nursing note reviewed.  Constitutional:      General: She is not in acute distress.    Appearance: She is well-developed.  HENT:     Head: Normocephalic and atraumatic.  Neck:     Musculoskeletal: Normal range of motion.  Cardiovascular:     Rate and Rhythm: Normal rate and regular rhythm.     Heart sounds: Normal heart sounds.  Pulmonary:     Effort: Pulmonary effort is normal.     Breath sounds: Normal breath sounds.  Abdominal:     General: There is no distension.     Palpations: Abdomen is soft.     Tenderness: There is no abdominal tenderness.  Musculoskeletal: Normal range of motion.  Skin:    General: Skin is warm and dry.  Neurological:     Mental Status: She is alert and oriented to person, place, and time.  Psychiatric:        Judgment: Judgment normal.      ED Treatments / Results  Labs (all labs ordered are listed, but only abnormal results are displayed) Labs Reviewed  CBC - Abnormal; Notable for the following components:      Result Value   WBC 11.6 (*)    RBC 3.53 (*)    Hemoglobin 10.9 (*)    HCT 34.5 (*)    All other components within normal limits    EKG None  Radiology No results found.  Procedures Procedures (including critical care time)  Medications Ordered in ED Medications  ondansetron (ZOFRAN-ODT) disintegrating tablet 8 mg (8 mg Oral Given 09/09/18 1024)  acetaminophen (TYLENOL) tablet 650 mg (650 mg Oral Given 09/09/18 1024)     Initial Impression / Assessment and Plan / ED Course  I have reviewed the triage vital signs and the nursing notes.  Pertinent labs & imaging results that were available during my care of the patient were  reviewed by me and considered in my medical decision making (see chart for details).     Overall the patient is well-appearing.  Stable for discharge.  Abdominal exam is benign.  Home with Zofran.  Final  Clinical Impressions(s) / ED Diagnoses   Final diagnoses:  Viral syndrome    ED Discharge Orders         Ordered    ondansetron (ZOFRAN ODT) 8 MG disintegrating tablet  Every 8 hours PRN     09/09/18 1113           Azalia Bilis, MD 09/09/18 1115

## 2018-09-21 ENCOUNTER — Encounter: Payer: Self-pay | Admitting: Student

## 2018-09-21 ENCOUNTER — Ambulatory Visit (INDEPENDENT_AMBULATORY_CARE_PROVIDER_SITE_OTHER): Payer: Medicaid Other | Admitting: Student

## 2018-09-21 VITALS — BP 130/80 | HR 72 | Wt 214.0 lb

## 2018-09-21 DIAGNOSIS — O039 Complete or unspecified spontaneous abortion without complication: Secondary | ICD-10-CM

## 2018-09-21 DIAGNOSIS — Z3042 Encounter for surveillance of injectable contraceptive: Secondary | ICD-10-CM | POA: Diagnosis not present

## 2018-09-21 HISTORY — DX: Complete or unspecified spontaneous abortion without complication: O03.9

## 2018-09-21 LAB — POCT PREGNANCY, URINE: Preg Test, Ur: NEGATIVE

## 2018-09-21 MED ORDER — MEDROXYPROGESTERONE ACETATE 150 MG/ML IM SUSP: 150.0000 mg | INTRAMUSCULAR | 0 refills | Status: DC

## 2018-09-21 MED ORDER — MEDROXYPROGESTERONE ACETATE 150 MG/ML IM SUSP
150.0000 mg | Freq: Once | INTRAMUSCULAR | Status: AC
  Administered 2018-09-21: 150 mg via INTRAMUSCULAR
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
## 2018-09-21 NOTE — Patient Instructions (Signed)
Hormonal Contraception Information  Hormonal contraception is a type of birth control that uses hormones to prevent pregnancy. It usually involves a combination of the hormones estrogen and progesterone or only the hormone progesterone. Hormonal contraception works in these ways:   It thickens the mucus in the cervix, making it harder for sperm to enter the uterus.   It changes the lining of the uterus, making it harder for an egg to implant.   It may stop the ovaries from releasing eggs (ovulation). Some women who take hormonal contraceptives that contain only progesterone may continue to ovulate.  Hormonal contraception cannot prevent sexually transmitted infections (STIs). Pregnancy may still occur.  Estrogen and progesterone contraceptives  Contraceptives that use a combination of estrogen and progesterone are available in these forms:   Pill. Pills come in different combinations of hormones. They must be taken at the same time each day. Pills can affect your period, causing you to get your period once every three months or not at all.   Patch. The patch must be worn on the lower abdomen for three weeks and then removed on the fourth.   Vaginal ring. The ring is placed in the vagina and left there for three weeks. It is then removed for one week.  Progesterone contraceptives  Contraceptives that use progesterone only are available in these forms:   Pill. Pills should be taken every day of the cycle.   Intrauterine device (IUD). This device is inserted into the uterus and removed or replaced every five years or sooner.   Implant. Plastic rods are placed under the skin of the upper arm. They are removed or replaced every three years or sooner.   Injection. The injection is given once every 90 days.  What are the side effects?  The side effects of estrogen and progesterone contraceptives include:   Nausea.   Headaches.   Breast tenderness.   Bleeding or spotting between menstrual cycles.   High blood  pressure (rare).   Strokes, heart attacks, or blood clots (rare)  Side effects of progesterone-only contraceptives include:   Nausea.   Headaches.   Breast tenderness.   Unpredictable menstrual bleeding.   High blood pressure (rare).  Talk to your health care provider about what side effects may affect you.  Where to find more information   Ask your health care provider for more information and resources about hormonal contraception.   U.S. Department of Health and Human Services Office on Women's Health: www.womenshealth.gov  Questions to ask:   What type of hormonal contraception is right for me?   How long should I plan to use hormonal contraception?   What are the side effects of the hormonal contraception method I choose?   How can I prevent STIs while using hormonal contraception?  Contact a health care provider if:   You start taking hormonal contraceptives and you develop persistent or severe side effects.  Summary   Estrogen and progesterone are hormones used in many forms of birth control.   Talk to your health care provider about what side effects may affect you.   Hormonal contraception cannot prevent sexually transmitted infections (STIs).   Ask your health care provider for more information and resources about hormonal contraception.  This information is not intended to replace advice given to you by your health care provider. Make sure you discuss any questions you have with your health care provider.  Document Released: 10/03/2007 Document Revised: 04/20/2018 Document Reviewed: 08/13/2016  Elsevier Interactive Patient Education    2019 Elsevier Inc.

## 2018-09-22 LAB — BETA HCG QUANT (REF LAB): hCG Quant: <1 m[IU]/mL

## 2018-12-21 ENCOUNTER — Ambulatory Visit: Payer: Self-pay

## 2019-10-09 ENCOUNTER — Emergency Department (HOSPITAL_COMMUNITY)
Admission: EM | Admit: 2019-10-09 | Discharge: 2019-10-09 | Disposition: A | Discharge status: Home / Self Care | Payer: Medicaid Other | Attending: Emergency Medicine | Admitting: Emergency Medicine

## 2019-10-09 ENCOUNTER — Encounter (HOSPITAL_COMMUNITY): Payer: Self-pay | Admitting: Emergency Medicine

## 2019-10-09 DIAGNOSIS — L02412 Cutaneous abscess of left axilla: Secondary | ICD-10-CM | POA: Insufficient documentation

## 2019-10-09 DIAGNOSIS — F17210 Nicotine dependence, cigarettes, uncomplicated: Secondary | ICD-10-CM | POA: Diagnosis not present

## 2019-10-09 DIAGNOSIS — L03112 Cellulitis of left axilla: Secondary | ICD-10-CM | POA: Insufficient documentation

## 2019-10-09 DIAGNOSIS — R2232 Localized swelling, mass and lump, left upper limb: Secondary | ICD-10-CM | POA: Diagnosis present

## 2019-10-09 MED ORDER — DOXYCYCLINE HYCLATE 100 MG PO CAPS: 100.0000 mg | ORAL_CAPSULE | Freq: Two times a day (BID) | ORAL | 0 refills | Status: AC

## 2019-10-09 MED ORDER — HYDROCODONE-ACETAMINOPHEN 5-325 MG PO TABS
1.0000 | ORAL_TABLET | Freq: Once | ORAL | Status: AC
  Administered 2019-10-09: 1 via ORAL
  Filled 2019-10-09: qty 1

## 2019-10-09 MED ORDER — NAPROXEN 500 MG PO TABS: 500.0000 mg | ORAL_TABLET | Freq: Two times a day (BID) | ORAL | 0 refills | Status: DC

## 2019-10-09 NOTE — ED Triage Notes (Signed)
Pt states she is having pain under left arm for 2 days and now feels a knot in her axilla.

## 2019-10-09 NOTE — ED Provider Notes (Signed)
[facility] EMERGENCY DEPARTMENT Provider Note   CSN: [id number] Arrival date & time: 10/09/19  0841     History Chief Complaint  Patient presents with  . Abscess    Kathryn Livingston is a 33 y.o. female with a past medical history significant for bipolar 1 disorder, obesity, and schizophrenia who presents to the ED due to gradual onset of worsening abscess in left axilla region x 2 days.  Patient notes months ago she noticed a small bump in the same area which drained pus which she describes as a "pimple" or "ingrown hair" from shaving. Patient admits to a history of abscesses that have required I&D in the past. Patient notes that the pain has begun to spread down her left arm from the axilla region associated with edema. Pain is worse with movement. She has not tried anything for pain. Patient denies injury to area. She denies fever and chills. Patient denies history of diabetes or other immunocomprimised health conditions.      Past Medical History:  Diagnosis Date  . Bipolar 1 disorder ([place])   . Breast abscess   . Headache   . Low maternal weight gain   . Obesity   . Schizophrenia ([place])   . Tobacco use in pregnancy     Patient Active Problem List   Diagnosis Date Noted  . Miscarriage 09/21/2018  . Encounter for management and injection of depo-Provera 09/21/2018  . Severe major depression, single episode, with psychotic features ([place]) 04/11/2016  . Alcohol abuse 04/11/2016  . Major depressive disorder, single episode, severe w psychotic behavior ([place]) 04/11/2016  . Spontaneous vaginal delivery 04/20/2015  . Polyhydramnios 04/19/2015  . Indication for care in labor or delivery 03/30/2015  . Labor and delivery, indication for care 03/20/2015  . Polyhydramnios in third trimester 03/17/2015  . Continuous tobacco abuse 02/26/2015  . Marijuana use 02/26/2015  . Low weight gain in pregnancy 02/26/2015  . Headache in pregnancy 02/26/2015  . Abnormal quad  screen 02/26/2015  . Anemia in pregnancy 02/26/2015    History reviewed. No pertinent surgical history.   OB History    Gravida  5   Para  4   Term  4   Preterm  0   AB  0   Living  4     SAB  0   TAB  0   Ectopic  0   Multiple      Live Births  4           Family History  Problem Relation Age of Onset  . Stroke Father   . Cancer Paternal Aunt     Social History   Tobacco Use  . Smoking status: Current Every Day Smoker    Packs/day: 0.25    Years: 15.00    Pack years: 3.75    Types: Cigarettes  . Smokeless tobacco: Never Used  Substance Use Topics  . Alcohol use: Yes    Comment: 2-3 40s a day for several years  . Drug use: Yes    Frequency: 7.0 times per week    Types: Marijuana    Home Medications Prior to Admission medications   Medication Sig Start Date End Date Taking? Authorizing Provider  doxycycline (VIBRAMYCIN) 100 MG capsule Take 1 capsule (100 mg total) by mouth 2 (two) times daily for 7 days. 10/09/19 10/16/19  Suzy Bouchard, PA-C  ibuprofen (ADVIL,MOTRIN) 600 MG tablet Take 1 tablet (600 mg total) every 6 (six) hours as needed  by mouth. 08/13/17   Elpidio Anis, PA-C  naproxen (NAPROSYN) 500 MG tablet Take 1 tablet (500 mg total) by mouth 2 (two) times daily. 10/09/19   Mannie Stabile, PA-C    Allergies    Nicoderm [nicotine]  Review of Systems   Review of Systems  Constitutional: Negative for chills and fever.  Respiratory: Negative for shortness of breath.   Cardiovascular: Negative for chest pain.  Gastrointestinal: Negative for abdominal pain, diarrhea, nausea and vomiting.  Skin: Positive for color change and wound (abscess).  Neurological: Negative for weakness and numbness.    Physical Exam Updated Vital Signs BP 118/77 (BP Location: Right Arm)   Pulse 94   Temp 98.2 F (36.8 C) (Oral)   Resp 18   SpO2 100%   Physical Exam Vitals and nursing note reviewed.  Constitutional:      General: She is not  in acute distress.    Appearance: She is not ill-appearing.  HENT:     Head: Normocephalic.  Eyes:     Pupils: Pupils are equal, round, and reactive to light.  Cardiovascular:     Rate and Rhythm: Normal rate and regular rhythm.     Pulses: Normal pulses.     Heart sounds: Normal heart sounds. No murmur. No friction rub. No gallop.   Pulmonary:     Effort: Pulmonary effort is normal.     Breath sounds: Normal breath sounds.  Abdominal:     General: Abdomen is flat. Bowel sounds are normal. There is no distension.     Palpations: Abdomen is soft.  Musculoskeletal:     Cervical back: Neck supple.     Comments: Decreased ROM of left shoulder due to pain. No bony tenderness. Normal left elbow with no tenderness and full ROM  Skin:    Comments: 3cm x 2cm area of induration with fluctuance under left axilla with surrounding erythema and warmth. Bedside ultrasound performed which demonstrates abscess.   Neurological:     General: No focal deficit present.     Mental Status: She is alert.     ED Results / Procedures / Treatments   Labs (all labs ordered are listed, but only abnormal results are displayed) Labs Reviewed - No data to display  EKG None  Radiology No results found.  Procedures Ultrasound ED Soft Tissue  Date/Time: 10/09/2019 10:32 AM Performed by: Mannie Stabile, PA-C Authorized by: Mannie Stabile, PA-C   Procedure details:    Indications: localization of abscess and evaluate for cellulitis     Transverse view:  Visualized   Longitudinal view:  Visualized   Images: archived     Limitations:  Patient compliance Location:    Location: axilla     Side:  Left Findings:     abscess present    cellulitis present    no foreign body present   (including critical care time)  Medications Ordered in ED Medications  HYDROcodone-acetaminophen (NORCO/VICODIN) 5-325 MG per tablet 1 tablet (1 tablet Oral Given 10/09/19 1041)    ED Course  I have  reviewed the triage vital signs and the nursing notes.  Pertinent labs & imaging results that were available during my care of the patient were reviewed by me and considered in my medical decision making (see chart for details).    MDM Rules/Calculators/A&P                      34 year old female presents to the ED due to an  abscess in the left axilla region that has progressively worsened over the past 2 days. Patient has a history of past abscesses which have required I&Ds. Bedside ultrasound confirmed abscess in left axilla region with surrounding cellulitis. Vitals all within normal limits. Patient is afebrile, not septic, and non-ill appearing. Patient deferred I&D of abscess at this time and would prefer to try antibiotics first. Discussed risks of not performing I&D and patient states understanding. Will discharge patient with naproxen and doxycycline. [facility] number given to patient at discharge. Patient advised to use a warm compress in the area. Patient advised to report to urgent care, [facility], or return to the ER if her symptoms do not improve within the next week. Advised patient to return to the ER sooner if she develops a fever or chills. Strict ED precautions discussed with patient. Patient states understanding and agrees to plan. Patient discharged home in no acute distress and stable vitals  Final Clinical Impression(s) / ED Diagnoses Final diagnoses:  Abscess of left axilla  Cellulitis of left axilla    Rx / DC Orders ED Discharge Orders         Ordered    naproxen (NAPROSYN) 500 MG tablet  2 times daily     10/09/19 1036    doxycycline (VIBRAMYCIN) 100 MG capsule  2 times daily     10/09/19 1036           Mannie Stabile, PA-C 10/09/19 1051    [place], DO 10/09/19 1206

## 2019-10-09 NOTE — Discharge Instructions (Addendum)
As discussed, you have a small abscess under your left arm with a surrounding infection. I am sending you home with a pain medication called Naproxen. You can take it twice a day as needed for pain. Do not mix with other over the counter medications. I am also sending you home with an antibiotic called doxycycline. Take twice a day for 7 days. Finish all antibiotics. Continue to use a warm compress in the area. If your symptoms do not improve within the next week go to urgent care, [facility], or return to the ER for drainage. Return sooner to the ER if you develop a fever or chills.

## 2020-03-22 ENCOUNTER — Emergency Department (HOSPITAL_COMMUNITY)
Admission: EM | Admit: 2020-03-22 | Discharge: 2020-03-22 | Disposition: A | Discharge status: Home / Self Care | Payer: Medicaid Other | Attending: Emergency Medicine | Admitting: Emergency Medicine

## 2020-03-22 ENCOUNTER — Other Ambulatory Visit: Payer: Self-pay

## 2020-03-22 DIAGNOSIS — M542 Cervicalgia: Secondary | ICD-10-CM | POA: Diagnosis present

## 2020-03-22 DIAGNOSIS — F17210 Nicotine dependence, cigarettes, uncomplicated: Secondary | ICD-10-CM | POA: Diagnosis not present

## 2020-03-22 MED ORDER — KETOROLAC TROMETHAMINE 60 MG/2ML IM SOLN
60.0000 mg | Freq: Once | INTRAMUSCULAR | Status: AC
  Administered 2020-03-22: 60 mg via INTRAMUSCULAR
  Filled 2020-03-22: qty 2

## 2020-03-22 MED ORDER — DIAZEPAM 5 MG PO TABS
5.0000 mg | ORAL_TABLET | Freq: Once | ORAL | Status: AC
  Administered 2020-03-22: 5 mg via ORAL
  Filled 2020-03-22: qty 1

## 2020-03-22 MED ORDER — CYCLOBENZAPRINE HCL 10 MG PO TABS
10.0000 mg | ORAL_TABLET | Freq: Two times a day (BID) | ORAL | 0 refills | Status: DC | PRN
Start: 2020-03-22 — End: 2020-03-25

## 2020-03-22 MED ORDER — HYDROMORPHONE HCL 1 MG/ML IJ SOLN: 1.0000 mg | Freq: Once | INTRAMUSCULAR | Status: DC

## 2020-03-22 NOTE — ED Provider Notes (Signed)
[facility] EMERGENCY DEPARTMENT Provider Note   CSN: [id number] Arrival date & time: 03/22/20  0038     History Chief Complaint  Patient presents with  . Neck Pain    Kathryn Livingston is a 34 y.o. female.   Neck Pain Pain location:  R side Quality:  Aching, stiffness and shooting Stiffness is present:  All day Pain severity:  Severe Duration:  1 week Timing:  Constant Relieved by:  Nothing Worsened by:  Bending and twisting Ineffective treatments:  None tried Associated symptoms: no tingling, no visual change and no weakness        Past Medical History:  Diagnosis Date  . Bipolar 1 disorder (HCC)   . Breast abscess   . Headache   . Low maternal weight gain   . Obesity   . Schizophrenia (HCC)   . Tobacco use in pregnancy     Patient Active Problem List   Diagnosis Date Noted  . Miscarriage 09/21/2018  . Encounter for management and injection of depo-Provera 09/21/2018  . Severe major depression, single episode, with psychotic features (HCC) 04/11/2016  . Alcohol abuse 04/11/2016  . Major depressive disorder, single episode, severe w psychotic behavior (HCC) 04/11/2016  . Spontaneous vaginal delivery 04/20/2015  . Polyhydramnios 04/19/2015  . Indication for care in labor or delivery 03/30/2015  . Labor and delivery, indication for care 03/20/2015  . Polyhydramnios in third trimester 03/17/2015  . Continuous tobacco abuse 02/26/2015  . Marijuana use 02/26/2015  . Low weight gain in pregnancy 02/26/2015  . Headache in pregnancy 02/26/2015  . Abnormal quad screen 02/26/2015  . Anemia in pregnancy 02/26/2015    No past surgical history on file.   OB History    Gravida  5   Para  4   Term  4   Preterm  0   AB  0   Living  4     SAB  0   TAB  0   Ectopic  0   Multiple      Live Births  4           Family History  Problem Relation Age of Onset  . Stroke Father   . Cancer Paternal Aunt     Social History    Tobacco Use  . Smoking status: Current Every Day Smoker    Packs/day: 0.25    Years: 15.00    Pack years: 3.75    Types: Cigarettes  . Smokeless tobacco: Never Used  Vaping Use  . Vaping Use: Never used  Substance Use Topics  . Alcohol use: Yes    Comment: 2-3 40s a day for several years  . Drug use: Yes    Frequency: 7.0 times per week    Types: Marijuana    Home Medications Prior to Admission medications   Medication Sig Start Date End Date Taking? Authorizing Provider  cyclobenzaprine (FLEXERIL) 10 MG tablet Take 1 tablet (10 mg total) by mouth 2 (two) times daily as needed for muscle spasms. 03/22/20   Kaden Daughdrill, Barbara Cower, MD    Allergies    Nicoderm [nicotine]  Review of Systems   Review of Systems  Musculoskeletal: Positive for neck pain.  Neurological: Negative for tingling and weakness.  All other systems reviewed and are negative.   Physical Exam Updated Vital Signs BP 112/85   Pulse 88   Temp 98.2 F (36.8 C) (Oral)   Resp 18   SpO2 99%   Physical Exam Vitals and  nursing note reviewed.  Constitutional:      Appearance: She is well-developed.  HENT:     Head: Normocephalic and atraumatic.     Nose: No congestion or rhinorrhea.     Mouth/Throat:     Mouth: Mucous membranes are moist.     Pharynx: Oropharynx is clear.  Eyes:     Pupils: Pupils are equal, round, and reactive to light.  Cardiovascular:     Rate and Rhythm: Normal rate and regular rhythm.  Pulmonary:     Effort: No respiratory distress.     Breath sounds: No stridor.  Abdominal:     General: There is no distension.  Musculoskeletal:        General: Tenderness (ttp to right SCM) present. No swelling.     Cervical back: Normal range of motion.  Skin:    General: Skin is warm and dry.  Neurological:     General: No focal deficit present.     Mental Status: She is alert.     ED Results / Procedures / Treatments   Labs (all labs ordered are listed, but only abnormal results are  displayed) Labs Reviewed - No data to display  EKG None  Radiology No results found.  Procedures Procedures (including critical care time)  Medications Ordered in ED Medications  diazepam (VALIUM) tablet 5 mg (5 mg Oral Given 03/22/20 0630)  ketorolac (TORADOL) injection 60 mg (60 mg Intramuscular Given 03/22/20 0630)    ED Course  I have reviewed the triage vital signs and the nursing notes.  Pertinent labs & imaging results that were available during my care of the patient were reviewed by me and considered in my medical decision making (see chart for details).    MDM Rules/Calculators/A&P                         No trauma to suggest fracture. No fever/infectious symptoms to suggest meningitis. No neuro changes to suggest primary  Neuro etiology. Seems c/w mild torticollis/spasm, will treat same.  Pain improving prior to discharge with muscle relaxers. Plan for dc on same.   Final Clinical Impression(s) / ED Diagnoses Final diagnoses:  Neck pain    Rx / DC Orders ED Discharge Orders         Ordered    cyclobenzaprine (FLEXERIL) 10 MG tablet  2 times daily PRN     Discontinue  Reprint     03/22/20 0655           Andrienne Havener, Corene Cornea, MD 03/23/20 [phone]

## 2020-03-22 NOTE — ED Triage Notes (Addendum)
Pt presents to Ed POv. Pt c/o neck pain x1w. Pt reporst worsening tonight, pt reports limited ROM. No fevers. Pt concerned because other has had anuerism and stroke. Pt neuro intact

## 2020-03-22 NOTE — ED Notes (Signed)
The pt is c/o the back of her neck hurting for one week she denies any injury.  She has not taken any tylenol or advil for this pain

## 2020-03-25 ENCOUNTER — Emergency Department (HOSPITAL_COMMUNITY)
Admission: EM | Admit: 2020-03-25 | Discharge: 2020-03-25 | Disposition: A | Discharge status: Home / Self Care | Payer: Medicaid Other | Attending: Emergency Medicine | Admitting: Emergency Medicine

## 2020-03-25 ENCOUNTER — Emergency Department (HOSPITAL_COMMUNITY): Payer: Medicaid Other

## 2020-03-25 ENCOUNTER — Other Ambulatory Visit: Payer: Self-pay

## 2020-03-25 ENCOUNTER — Encounter (HOSPITAL_COMMUNITY): Payer: Self-pay

## 2020-03-25 DIAGNOSIS — R519 Headache, unspecified: Secondary | ICD-10-CM | POA: Diagnosis not present

## 2020-03-25 DIAGNOSIS — M542 Cervicalgia: Secondary | ICD-10-CM | POA: Insufficient documentation

## 2020-03-25 DIAGNOSIS — F17210 Nicotine dependence, cigarettes, uncomplicated: Secondary | ICD-10-CM | POA: Diagnosis not present

## 2020-03-25 MED ORDER — LIDOCAINE 5 % EX PTCH
1.0000 | MEDICATED_PATCH | CUTANEOUS | Status: DC
  Administered 2020-03-25: 1 via TRANSDERMAL
  Filled 2020-03-25: qty 1

## 2020-03-25 MED ORDER — OXYCODONE-ACETAMINOPHEN 5-325 MG PO TABS
1.0000 | ORAL_TABLET | Freq: Once | ORAL | Status: AC
  Administered 2020-03-25: 1 via ORAL
  Filled 2020-03-25: qty 1

## 2020-03-25 MED ORDER — LIDOCAINE 5 % EX PTCH: 1.0000 | MEDICATED_PATCH | CUTANEOUS | 0 refills | Status: DC

## 2020-03-25 MED ORDER — NAPROXEN 500 MG PO TABS
500.0000 mg | ORAL_TABLET | Freq: Two times a day (BID) | ORAL | 0 refills | Status: DC | PRN
Start: 2020-03-25 — End: 2021-01-28

## 2020-03-25 MED ORDER — METHOCARBAMOL 500 MG PO TABS
500.0000 mg | ORAL_TABLET | Freq: Three times a day (TID) | ORAL | 0 refills | Status: DC | PRN
Start: 2020-03-25 — End: 2021-01-28

## 2020-03-25 NOTE — Discharge Instructions (Addendum)
You were seen in the emergency department today for neck pain.  The CT scans did not show any fractures, they did show findings that could be indicative of a muscle spasm.  We are sending you home with the following medicines:  - Naproxen is a nonsteroidal anti-inflammatory medication that will help with pain and swelling. Be sure to take this medication as prescribed with food, 1 pill every 12 hours,  It should be taken with food, as it can cause stomach upset, and more seriously, stomach bleeding. Do not take other nonsteroidal anti-inflammatory medications with this such as Advil, Motrin, Aleve, Mobic, Goodie Powder, or Motrin.    - Robaxin is the muscle relaxer I have prescribed, this is meant to help with muscle tightness. Be aware that this medication may make you drowsy therefore the first time you take this it should be at a time you are in an environment where you can rest. Do not drive or operate heavy machinery when taking this medication. Do not drink alcohol or take other sedating medications with this medicine such as narcotics or benzodiazepines.   -Lidoderm patch: This is a topical patch to place over your most significant area of pain once per day, remove and discard patch within 12 hours.  You make take Tylenol per over the counter dosing with these medications.   We have prescribed you new medication(s) today. Discuss the medications prescribed today with your pharmacist as they can have adverse effects and interactions with your other medicines including over the counter and prescribed medications. Seek medical evaluation if you start to experience new or abnormal symptoms after taking one of these medicines, seek care immediately if you start to experience difficulty breathing, feeling of your throat closing, facial swelling, or rash as these could be indications of a more serious allergic reaction   Please apply heat and muscle massage to the affected area.  Please follow-up with  your primary care provider and/or orthopedics within 3 to 5 days.  Return to the ER for new or worsening symptoms including but not limited to worsening pain, numbness, tingling, weakness, loss of control bowel or bladder function, change in your vision, facial droop, slurred speech, or any other concerns.
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
## 2020-09-27 NOTE — L&D Delivery Note (Signed)
OB/GYN Faculty Practice Delivery Note  Kathryn Livingston is a 35 y.o. [id number] s/p SVD at [redacted]w[redacted]d. She was admitted for labor and nrFHT.   ROM: 0h 5m with thick meconium fluid GBS Status: positive (only received one dose of PCN) Maximum Maternal Temperature: 98.3   Labor Progress: Patient presented in early labor at term with nrFHT on monitor. She was expectantly managed and was ultimately complete, +4 with abulging bag out of vagina and having variables to the 70s. Therefore she was then [facility] (although initially plan was to await as long as possible to AROM since patient was only given one dose of antibiotics for GBS ppx so far) and infant found to be crowning.  Delivery Date/Time: 2023 on 08/31/2021 Delivery: Head delivered LOA. Cord was found to be wrapped around body and reduced and shoulder and body delivered in usual fashion.  Infant initially with spontaneous cry and good tone, however due to thick meconium cord was clamped and cut and infant was handed over to awaiting pediatric team.  Cord blood drawn. Placenta delivered spontaneously with gentle cord traction. Fundus firm with massage and Pitocin. Labia, perineum, vagina, and cervix inspected and intact.   Placenta: 3V cord, intact, sent to path Complications: None Lacerations: None EBL: 150cc Analgesia: epidural   Infant: female  APGARs 5,7,8  2580g  Warner Mccreedy, MD, MPH OB Fellow, Faculty [facility] for [facility], [facility] 08/30/2021, 9:37 PM

## 2021-01-28 ENCOUNTER — Encounter (HOSPITAL_COMMUNITY): Payer: Self-pay | Admitting: Obstetrics & Gynecology

## 2021-01-28 ENCOUNTER — Inpatient Hospital Stay (HOSPITAL_COMMUNITY): Payer: Medicaid Other

## 2021-01-28 ENCOUNTER — Other Ambulatory Visit: Payer: Self-pay

## 2021-01-28 ENCOUNTER — Inpatient Hospital Stay (HOSPITAL_COMMUNITY)
Admission: AD | Admit: 2021-01-28 | Discharge: 2021-01-28 | Disposition: A | Discharge status: Home / Self Care | Payer: Medicaid Other | Attending: Obstetrics & Gynecology | Admitting: Obstetrics & Gynecology

## 2021-01-28 DIAGNOSIS — O99331 Smoking (tobacco) complicating pregnancy, first trimester: Secondary | ICD-10-CM | POA: Diagnosis not present

## 2021-01-28 DIAGNOSIS — R109 Unspecified abdominal pain: Secondary | ICD-10-CM | POA: Diagnosis present

## 2021-01-28 DIAGNOSIS — O26899 Other specified pregnancy related conditions, unspecified trimester: Secondary | ICD-10-CM

## 2021-01-28 DIAGNOSIS — F17210 Nicotine dependence, cigarettes, uncomplicated: Secondary | ICD-10-CM | POA: Insufficient documentation

## 2021-01-28 DIAGNOSIS — O99891 Other specified diseases and conditions complicating pregnancy: Secondary | ICD-10-CM

## 2021-01-28 DIAGNOSIS — R300 Dysuria: Secondary | ICD-10-CM | POA: Insufficient documentation

## 2021-01-28 DIAGNOSIS — O09521 Supervision of elderly multigravida, first trimester: Secondary | ICD-10-CM | POA: Diagnosis not present

## 2021-01-28 DIAGNOSIS — Z3A08 8 weeks gestation of pregnancy: Secondary | ICD-10-CM | POA: Insufficient documentation

## 2021-01-28 DIAGNOSIS — O26891 Other specified pregnancy related conditions, first trimester: Secondary | ICD-10-CM | POA: Diagnosis not present

## 2021-01-28 DIAGNOSIS — M549 Dorsalgia, unspecified: Secondary | ICD-10-CM | POA: Diagnosis not present

## 2021-01-28 HISTORY — DX: Depression, unspecified: F32.A

## 2021-01-28 HISTORY — DX: Unspecified infectious disease: B99.9

## 2021-01-28 LAB — CBC
HCT: 30.2 % — ABNORMAL LOW (ref 36.0–46.0)
Hemoglobin: 10.2 g/dL — ABNORMAL LOW (ref 12.0–15.0)
MCH: 31.2 pg (ref 26.0–34.0)
MCHC: 33.8 g/dL (ref 30.0–36.0)
MCV: 92.4 fL (ref 80.0–100.0)
Platelets: 330 10*3/uL (ref 150–400)
RBC: 3.27 MIL/uL — ABNORMAL LOW (ref 3.87–5.11)
RDW: 13 % (ref 11.5–15.5)
WBC: 8 10*3/uL (ref 4.0–10.5)
nRBC: 0 % (ref 0.0–0.2)

## 2021-01-28 LAB — URINALYSIS, ROUTINE W REFLEX MICROSCOPIC
Bacteria, UA: NONE SEEN
Bilirubin Urine: NEGATIVE
Glucose, UA: NEGATIVE mg/dL
Ketones, ur: NEGATIVE mg/dL
Nitrite: NEGATIVE
Protein, ur: NEGATIVE mg/dL
Specific Gravity, Urine: 1.018 (ref 1.005–1.030)
pH: 6 (ref 5.0–8.0)

## 2021-01-28 LAB — WET PREP, GENITAL
Clue Cells Wet Prep HPF POC: NONE SEEN
Sperm: NONE SEEN
Trich, Wet Prep: NONE SEEN
Yeast Wet Prep HPF POC: NONE SEEN

## 2021-01-28 LAB — COMPREHENSIVE METABOLIC PANEL
ALT: 11 U/L (ref 0–44)
AST: 13 U/L — ABNORMAL LOW (ref 15–41)
Albumin: 3.6 g/dL (ref 3.5–5.0)
Alkaline Phosphatase: 51 U/L (ref 38–126)
Anion gap: 7 (ref 5–15)
BUN: <5 mg/dL — ABNORMAL LOW (ref 6–20)
CO2: 24 mmol/L (ref 22–32)
Calcium: 9 mg/dL (ref 8.9–10.3)
Chloride: 102 mmol/L (ref 98–111)
Creatinine, Ser: 0.63 mg/dL (ref 0.44–1.00)
GFR, Estimated: >60 mL/min (ref >60)
Glucose, Bld: 91 mg/dL (ref 70–99)
Potassium: 3.6 mmol/L (ref 3.5–5.1)
Sodium: 133 mmol/L — ABNORMAL LOW (ref 135–145)
Total Bilirubin: 0.6 mg/dL (ref 0.3–1.2)
Total Protein: 6.6 g/dL (ref 6.5–8.1)

## 2021-01-28 LAB — HCG, QUANTITATIVE, PREGNANCY: hCG, Beta Chain, Quant, S: 92356 m[IU]/mL — ABNORMAL HIGH (ref <5)

## 2021-01-28 LAB — I-STAT BETA HCG BLOOD, ED (MC, WL, AP ONLY): I-stat hCG, quantitative: >2000 m[IU]/mL — ABNORMAL HIGH (ref <5)

## 2021-01-28 LAB — LIPASE, BLOOD: Lipase: 41 U/L (ref 11–51)

## 2021-01-28 LAB — HIV ANTIBODY (ROUTINE TESTING W REFLEX): HIV Screen 4th Generation wRfx: NONREACTIVE

## 2021-01-28 MED ORDER — PROMETHAZINE HCL 12.5 MG PO TABS: 12.5000 mg | ORAL_TABLET | Freq: Four times a day (QID) | ORAL | 0 refills | Status: DC | PRN

## 2021-01-28 MED ORDER — PREPLUS 27-1 MG PO TABS: 1.0000 | ORAL_TABLET | Freq: Every day | ORAL | 13 refills | Status: DC

## 2021-01-28 NOTE — ED Triage Notes (Signed)
Pt reports LMP 3/4 and now having abdominal pain, one episode of n/v, and burning with urination. Requesting STD testing.

## 2021-01-28 NOTE — ED Provider Notes (Signed)
Emergency Medicine Provider OB Triage Evaluation Note  Kathryn Livingston is a 35 y.o. female, [id number], at Unknown gestation who presents to the emergency department with complaints of lower abdominal cramping, urinary symptoms such as dysuria.  Last menstrual period March 4.  Review of  Systems  Positive: Abdominal pain, dysuria Negative: Fever  Physical Exam  BP 116/73   Pulse 86   Temp 98.8 F (37.1 C)   Resp 16   LMP 11/28/2020 (Exact Date)   SpO2 99%  General: Awake, no distress  HEENT: Atraumatic  Resp: Normal effort  Cardiac: Normal rate Abd: Nondistended, nontender  MSK: Moves all extremities without difficulty Neuro: Speech clear  Medical Decision Making  Pt evaluated for pregnancy concern and is stable for transfer to MAU. Pt is in agreement with plan for transfer.  10:14 AM Discussed with MAU APP, Rolitta, who accepts patient in transfer.  Clinical Impression  No diagnosis found.     Claude Manges, PA-C 01/28/21 1014    Pricilla Loveless, MD 01/29/21 [phone]

## 2021-01-28 NOTE — MAU Note (Signed)
No period since March, had not done a home test.  Stomach has been hurting and low back - been hurting for a couple days. Sharp  Pain comes and goes. Burning with urination.

## 2021-01-28 NOTE — MAU Provider Note (Signed)
History     CSN: [id number]  Arrival date and time: 01/28/21 4696   Event Date/Time   First Provider Initiated Contact with Patient 01/28/21 1112      Chief Complaint  Patient presents with  . Abdominal Pain  . Possible Pregnancy  . Back Pain   Ms. Kathryn Livingston is a 35 y.o. year old [id number] female at [redacted]w[redacted]d weeks gestation who was to [facility] from [facility] reporting LMP of 11/28/20, lower abdominal pain that radiates to the lower back for 2 days and burning with urination. She did not take any HPT, but had iStat HCG of >2000 in [facility] today. She denies any vaginal bleeding or abnormal vaginal discharge. She has not established PNC at this time.    OB History    Gravida  6   Para  4   Term  4   Preterm  0   AB  1   Living  4     SAB  1   IAB  0   Ectopic  0   Multiple      Live Births  4           Past Medical History:  Diagnosis Date  . Bipolar 1 disorder (HCC)   . Breast abscess   . Depression    doing ok  . Headache   . Infection    UTI  . Low maternal weight gain   . Obesity   . Schizophrenia (HCC)   . Tobacco use in pregnancy     Past Surgical History:  Procedure Laterality Date  . DILATION AND CURETTAGE OF UTERUS      Family History  Problem Relation Age of Onset  . Hypertension Mother   . Stroke Mother   . Stroke Father   . Hypertension Father   . Cancer Paternal Aunt     Social History   Tobacco Use  . Smoking status: Current Every Day Smoker    Packs/day: 0.25    Years: 15.00    Pack years: 3.75    Types: Cigarettes  . Smokeless tobacco: Never Used  Vaping Use  . Vaping Use: Never used  Substance Use Topics  . Alcohol use: Yes    Comment: 1 beer  . Drug use: Yes    Frequency: 7.0 times per week    Types: Marijuana    Allergies:  Allergies  Allergen Reactions  . Nicoderm [Nicotine]     Medications Prior to Admission  Medication Sig Dispense Refill Last Dose  . lidocaine (LIDODERM) 5 % Place 1 patch onto the skin daily.  Place patch onto right side of neck once per day as needed for pain. Remove & Discard patch within 12 hours 30 patch 0   . methocarbamol (ROBAXIN) 500 MG tablet Take 1 tablet (500 mg total) by mouth every 8 (eight) hours as needed for muscle spasms. 15 tablet 0   . naproxen (NAPROSYN) 500 MG tablet Take 1 tablet (500 mg total) by mouth 2 (two) times daily as needed for moderate pain. 15 tablet 0     Review of Systems  Constitutional: Negative.   HENT: Negative.   Eyes: Negative.   Respiratory: Negative.   Cardiovascular: Negative.   Gastrointestinal: Positive for abdominal pain.  Endocrine: Negative.   Genitourinary: Positive for pelvic pain.  Musculoskeletal: Positive for back pain.  Skin: Negative.   Allergic/Immunologic: Negative.   Neurological: Negative.   Hematological: Negative.   Psychiatric/Behavioral: Negative.    Physical Exam  Blood pressure 115/69, pulse 83, temperature 98.8 F (37.1 C), resp. rate 16, height 5\' 6"  (1.676 m), weight 93.8 kg, last menstrual period 11/28/2020, SpO2 99 %, unknown if currently breastfeeding.  Physical Exam Vitals and nursing note reviewed.  Constitutional:      Appearance: Normal appearance. She is obese.  Cardiovascular:     Rate and Rhythm: Normal rate.  Genitourinary:    Comments: Vaginal swabs collected by self-swab Neurological:     Mental Status: She is alert and oriented to person, place, and time.  Psychiatric:        Mood and Affect: Mood normal.        Behavior: Behavior normal.        Thought Content: Thought content normal.        Judgment: Judgment normal.     MAU Course  Procedures  MDM - some labs performed at [facility] UPT CBC ABO/Rh HCG Wet Prep GC/CT -- pending HIV -- pending OB < 14 wks 01/28/2021 with TV  Results for orders placed or performed during the hospital encounter of 01/28/21 (from the past 24 hour(s))  Urinalysis, Routine w reflex microscopic     Status: Abnormal   Collection Time: 01/28/21   9:44 AM  Result Value Ref Range   Color, Urine YELLOW YELLOW   APPearance CLEAR CLEAR   Specific Gravity, Urine 1.018 1.005 - 1.030   pH 6.0 5.0 - 8.0   Glucose, UA NEGATIVE NEGATIVE mg/dL   Hgb urine dipstick SMALL (A) NEGATIVE   Bilirubin Urine NEGATIVE NEGATIVE   Ketones, ur NEGATIVE NEGATIVE mg/dL   Protein, ur NEGATIVE NEGATIVE mg/dL   Nitrite NEGATIVE NEGATIVE   Leukocytes,Ua TRACE (A) NEGATIVE   RBC / HPF 0-5 0 - 5 RBC/hpf   WBC, UA 0-5 0 - 5 WBC/hpf   Bacteria, UA NONE SEEN NONE SEEN   Squamous Epithelial / LPF 0-5 0 - 5   Mucus PRESENT   Lipase, blood     Status: None   Collection Time: 01/28/21  9:46 AM  Result Value Ref Range   Lipase 41 11 - 51 U/L  Comprehensive metabolic panel     Status: Abnormal   Collection Time: 01/28/21  9:46 AM  Result Value Ref Range   Sodium 133 (L) 135 - 145 mmol/L   Potassium 3.6 3.5 - 5.1 mmol/L   Chloride 102 98 - 111 mmol/L   CO2 24 22 - 32 mmol/L   Glucose, Bld 91 70 - 99 mg/dL   BUN <5 (L) 6 - 20 mg/dL   Creatinine, Ser 03/30/21 0.44 - 1.00 mg/dL   Calcium 9.0 8.9 - 3.15 mg/dL   Total Protein 6.6 6.5 - 8.1 g/dL   Albumin 3.6 3.5 - 5.0 g/dL   AST 13 (L) 15 - 41 U/L   ALT 11 0 - 44 U/L   Alkaline Phosphatase 51 38 - 126 U/L   Total Bilirubin 0.6 0.3 - 1.2 mg/dL   GFR, Estimated 40.0 >86 mL/min   Anion gap 7 5 - 15  CBC     Status: Abnormal   Collection Time: 01/28/21  9:46 AM  Result Value Ref Range   WBC 8.0 4.0 - 10.5 K/uL   RBC 3.27 (L) 3.87 - 5.11 MIL/uL   Hemoglobin 10.2 (L) 12.0 - 15.0 g/dL   HCT 03/30/21 (L) 19.5 - 09.3 %   MCV 92.4 80.0 - 100.0 fL   MCH 31.2 26.0 - 34.0 pg   MCHC 33.8 30.0 - 36.0  g/dL   RDW 03.0 09.2 - 33.0 %   Platelets 330 150 - 400 K/uL   nRBC 0.0 0.0 - 0.2 %  hCG, quantitative, pregnancy     Status: Abnormal   Collection Time: 01/28/21  9:46 AM  Result Value Ref Range   hCG, Beta Chain, Quant, S 92,356 (H) <5 mIU/mL  I-Stat beta hCG blood, ED     Status: Abnormal   Collection Time: 01/28/21   9:48 AM  Result Value Ref Range   I-stat hCG, quantitative >2,000.0 (H) <5 mIU/mL   Comment 3          HIV Antibody (routine testing w rflx)     Status: None   Collection Time: 01/28/21 10:53 AM  Result Value Ref Range   HIV Screen 4th Generation wRfx Non Reactive Non Reactive  Wet prep, genital     Status: Abnormal   Collection Time: 01/28/21 11:26 AM   Specimen: Vaginal  Result Value Ref Range   Yeast Wet Prep HPF POC NONE SEEN NONE SEEN   Trich, Wet Prep NONE SEEN NONE SEEN   Clue Cells Wet Prep HPF POC NONE SEEN NONE SEEN   WBC, Wet Prep HPF POC FEW (A) NONE SEEN   Sperm NONE SEEN     US OB Comp Less 14 Wks  Result Date: 01/28/2021 CLINICAL DATA:  Positive beta HCG (92,356) with cramping EXAM: OBSTETRIC <14 WK ULTRASOUND TECHNIQUE: Transabdominal ultrasound was performed for evaluation of the gestation as well as the maternal uterus and adnexal regions. COMPARISON:  Ultrasound September 04, 2018 FINDINGS: Intrauterine gestational sac: Single Yolk sac:  Visualized. Embryo:  Visualized. Cardiac Activity: Visualized. Heart Rate: 176 bpm CRL: 18 mm   8 w 1 d                  [place] EDC: September 08, 2021 Subchorionic hemorrhage:  Small volume Maternal uterus/adnexae: Unremarkable IMPRESSION: Single viable early intrauterine gestation with small volume subchorionic hemorrhage, further described above. Electronically Signed   By: Maudry Mayhew MD   On: 01/28/2021 13:24     Assessment and Plan  Abdominal cramping complicating pregnancy  - Information provided on abd pain in preg   Back pain affecting pregnancy in first trimester  - Information provided on back pain in pregnancy   - Discharge patient - Advised to call [facility] to establish [facility] - Patient verbalized an understanding of the plan of care and agrees.    Raelyn Mora, CNM 01/28/2021, 11:15 AM

## 2021-01-29 LAB — GC/CHLAMYDIA PROBE AMP (~~LOC~~) NOT AT ARMC
Chlamydia: POSITIVE — AB
Comment: NEGATIVE
Comment: NORMAL
Neisseria Gonorrhea: POSITIVE — AB

## 2021-02-02 ENCOUNTER — Telehealth: Payer: Self-pay | Admitting: Lactation Services

## 2021-02-02 ENCOUNTER — Telehealth: Payer: Self-pay | Admitting: Family Medicine

## 2021-02-02 NOTE — Telephone Encounter (Signed)
Pt states that she was at ED 01-28-21 and they had rx on discharge papers but Pharmacy states no meds to pick up.. was sent to   [id number] - [place] ([place]), [place] - [address]   PrePLUS . promethazine  Address: [address] [place] ([place]) [place] [address]  Phone: [phone]    Pt wants to know what to do about this

## 2021-02-02 NOTE — Telephone Encounter (Signed)
[facility] and spoke with Al. He reports patient's prescriptions are there and ready for pick up.  Called and LM for patient that she does have the 2 prescriptions ready to pick up at the pharmacy.

## 2021-02-02 NOTE — Telephone Encounter (Signed)
Patient called and LM to call in regards to her Prescriptions.   Called patient to let her know I have called Pharmacy and medications are ready for pick up.

## 2021-02-18 ENCOUNTER — Emergency Department (HOSPITAL_COMMUNITY)
Admission: EM | Admit: 2021-02-18 | Discharge: 2021-02-19 | Disposition: A | Discharge status: Home / Self Care | Payer: Medicaid Other | Attending: Emergency Medicine | Admitting: Emergency Medicine

## 2021-02-18 ENCOUNTER — Other Ambulatory Visit: Payer: Self-pay

## 2021-02-18 ENCOUNTER — Encounter (HOSPITAL_COMMUNITY): Payer: Self-pay | Admitting: Emergency Medicine

## 2021-02-18 DIAGNOSIS — J029 Acute pharyngitis, unspecified: Secondary | ICD-10-CM | POA: Diagnosis not present

## 2021-02-18 DIAGNOSIS — R5383 Other fatigue: Secondary | ICD-10-CM | POA: Insufficient documentation

## 2021-02-18 DIAGNOSIS — Z20822 Contact with and (suspected) exposure to covid-19: Secondary | ICD-10-CM | POA: Diagnosis not present

## 2021-02-18 DIAGNOSIS — J3489 Other specified disorders of nose and nasal sinuses: Secondary | ICD-10-CM | POA: Diagnosis not present

## 2021-02-18 DIAGNOSIS — R509 Fever, unspecified: Secondary | ICD-10-CM | POA: Diagnosis not present

## 2021-02-18 DIAGNOSIS — F17210 Nicotine dependence, cigarettes, uncomplicated: Secondary | ICD-10-CM | POA: Insufficient documentation

## 2021-02-18 DIAGNOSIS — R0981 Nasal congestion: Secondary | ICD-10-CM

## 2021-02-18 DIAGNOSIS — R059 Cough, unspecified: Secondary | ICD-10-CM | POA: Diagnosis not present

## 2021-02-18 NOTE — ED Triage Notes (Signed)
Pt reports nasal congestion, cough and fatigue X2 days.  Pt reports she "thinks she is three months pregnant.  No prenatal care since week 8 when she found out she was pregnant.

## 2021-02-19 ENCOUNTER — Ambulatory Visit (INDEPENDENT_AMBULATORY_CARE_PROVIDER_SITE_OTHER): Payer: Medicaid Other | Admitting: Certified Nurse Midwife

## 2021-02-19 ENCOUNTER — Encounter: Payer: Self-pay | Admitting: Certified Nurse Midwife

## 2021-02-19 VITALS — BP 112/66 | HR 109 | Wt 209.2 lb

## 2021-02-19 DIAGNOSIS — A599 Trichomoniasis, unspecified: Secondary | ICD-10-CM

## 2021-02-19 DIAGNOSIS — D508 Other iron deficiency anemias: Secondary | ICD-10-CM | POA: Diagnosis not present

## 2021-02-19 DIAGNOSIS — Z3A11 11 weeks gestation of pregnancy: Secondary | ICD-10-CM | POA: Diagnosis not present

## 2021-02-19 DIAGNOSIS — O09521 Supervision of elderly multigravida, first trimester: Secondary | ICD-10-CM

## 2021-02-19 DIAGNOSIS — O09529 Supervision of elderly multigravida, unspecified trimester: Secondary | ICD-10-CM | POA: Insufficient documentation

## 2021-02-19 DIAGNOSIS — O0990 Supervision of high risk pregnancy, unspecified, unspecified trimester: Secondary | ICD-10-CM | POA: Diagnosis not present

## 2021-02-19 LAB — HEPATITIS C ANTIBODY: HCV Ab: NEGATIVE

## 2021-02-19 LAB — POCT URINALYSIS DIP (DEVICE)
Bilirubin Urine: NEGATIVE
Glucose, UA: NEGATIVE mg/dL
Ketones, ur: NEGATIVE mg/dL
Leukocytes,Ua: NEGATIVE
Nitrite: NEGATIVE
Protein, ur: NEGATIVE mg/dL
Specific Gravity, Urine: 1.015 (ref 1.005–1.030)
Urobilinogen, UA: 0.2 mg/dL (ref 0.0–1.0)
pH: 5.5 (ref 5.0–8.0)

## 2021-02-19 LAB — SARS CORONAVIRUS 2 (TAT 6-24 HRS): SARS Coronavirus 2: NEGATIVE

## 2021-02-19 MED ORDER — ALBUTEROL SULFATE HFA 108 (90 BASE) MCG/ACT IN AERS
2.0000 | INHALATION_SPRAY | Freq: Once | RESPIRATORY_TRACT | Status: AC
  Administered 2021-02-19: 2 via RESPIRATORY_TRACT
  Filled 2021-02-19: qty 6.7

## 2021-02-19 MED ORDER — BLOOD PRESSURE KIT DEVI: 1.0000 | Freq: Once | 0 refills | Status: AC

## 2021-02-19 MED ORDER — AEROCHAMBER PLUS FLO-VU MISC
1.0000 | Freq: Once | Status: AC
  Administered 2021-02-19: 1
  Filled 2021-02-19: qty 1

## 2021-02-19 MED ORDER — AEROCHAMBER PLUS FLO-VU LARGE MISC
Status: AC
  Filled 2021-02-19: qty 1

## 2021-02-19 NOTE — ED Notes (Signed)
Pt placed back on monitor after ambulating to restroom. Pt resting at this time. Pt says she has no pain, is just tired. Pt does report that she feels like inhaler did help her son. No further needs expressed at this time.

## 2021-02-19 NOTE — ED Provider Notes (Signed)
[facility] EMERGENCY DEPARTMENT Provider Note   CSN: [id number] Arrival date & time: 02/18/21  2305     History Chief Complaint  Patient presents with  . Nasal Congestion  . Fatigue    Kathryn Livingston is a 35 y.o. female.  The history is provided by the patient.  URI Presenting symptoms: congestion, cough, fatigue, rhinorrhea and sore throat   Presenting symptoms: no fever   Severity:  Mild Onset quality:  Gradual Timing:  Constant Progression:  Worsening Chronicity:  New Relieved by:  None tried Worsened by:  Nothing Ineffective treatments:  None tried Associated symptoms: myalgias, sinus pain, sneezing and wheezing   Associated symptoms: no arthralgias and no neck pain        Past Medical History:  Diagnosis Date  . Bipolar 1 disorder (HCC)   . Breast abscess   . Depression    doing ok  . Headache   . Infection    UTI  . Low maternal weight gain   . Obesity   . Schizophrenia (HCC)   . Tobacco use in pregnancy     Patient Active Problem List   Diagnosis Date Noted  . Miscarriage 09/21/2018  . Encounter for management and injection of depo-Provera 09/21/2018  . Severe major depression, single episode, with psychotic features (HCC) 04/11/2016  . Alcohol abuse 04/11/2016  . Major depressive disorder, single episode, severe w psychotic behavior (HCC) 04/11/2016  . Spontaneous vaginal delivery 04/20/2015  . Polyhydramnios 04/19/2015  . Indication for care in labor or delivery 03/30/2015  . Labor and delivery, indication for care 03/20/2015  . Polyhydramnios in third trimester 03/17/2015  . Continuous tobacco abuse 02/26/2015  . Marijuana use 02/26/2015  . Low weight gain in pregnancy 02/26/2015  . Headache in pregnancy 02/26/2015  . Abnormal quad screen 02/26/2015  . Anemia in pregnancy 02/26/2015    Past Surgical History:  Procedure Laterality Date  . DILATION AND CURETTAGE OF UTERUS       OB History    Gravida  6   Para   4   Term  4   Preterm  0   AB  1   Living  4     SAB  1   IAB  0   Ectopic  0   Multiple      Live Births  4           Family History  Problem Relation Age of Onset  . Hypertension Mother   . Stroke Mother   . Stroke Father   . Hypertension Father   . Cancer Paternal Aunt     Social History   Tobacco Use  . Smoking status: Current Every Day Smoker    Packs/day: 0.25    Years: 15.00    Pack years: 3.75    Types: Cigarettes  . Smokeless tobacco: Never Used  Vaping Use  . Vaping Use: Never used  Substance Use Topics  . Alcohol use: Yes    Comment: 1 beer  . Drug use: Yes    Frequency: 7.0 times per week    Types: Marijuana    Home Medications Prior to Admission medications   Medication Sig Start Date End Date Taking? Authorizing Provider  Prenatal Vit-Fe Fumarate-FA (PREPLUS) 27-1 MG TABS Take 1 tablet by mouth daily. 01/28/21   Raelyn Mora, CNM  promethazine (PHENERGAN) 12.5 MG tablet Take 1 tablet (12.5 mg total) by mouth every 6 (six) hours as needed for nausea or vomiting. 01/28/21  Raelyn Mora, CNM    Allergies    Nicoderm [nicotine]  Review of Systems   Review of Systems  Constitutional: Positive for fatigue. Negative for fever.  HENT: Positive for congestion, rhinorrhea, sinus pain, sneezing and sore throat.   Respiratory: Positive for cough and wheezing.   Musculoskeletal: Positive for myalgias. Negative for arthralgias and neck pain.  All other systems reviewed and are negative.   Physical Exam Updated Vital Signs BP 112/65   Pulse 91   Temp 98.6 F (37 C) (Oral)   Resp 18   LMP 11/28/2020 (Exact Date)   SpO2 99%   Physical Exam Vitals and nursing note reviewed.  Constitutional:      Appearance: She is well-developed.  HENT:     Head: Normocephalic and atraumatic.     Mouth/Throat:     Mouth: Mucous membranes are moist.     Pharynx: Oropharynx is clear.  Eyes:     Pupils: Pupils are equal, round, and reactive to  light.  Cardiovascular:     Rate and Rhythm: Normal rate and regular rhythm.  Pulmonary:     Effort: No respiratory distress.     Breath sounds: No stridor.  Abdominal:     General: Abdomen is flat. There is no distension.  Musculoskeletal:     Cervical back: Normal range of motion.  Skin:    General: Skin is warm and dry.  Neurological:     General: No focal deficit present.     Mental Status: She is alert.     ED Results / Procedures / Treatments   Labs (all labs ordered are listed, but only abnormal results are displayed) Labs Reviewed  SARS CORONAVIRUS 2 (TAT 6-24 HRS)    EKG None  Radiology No results found.  Procedures Procedures   Medications Ordered in ED Medications  albuterol (VENTOLIN HFA) 108 (90 Base) MCG/ACT inhaler 2 puff (2 puffs Inhalation Given 02/19/21 0302)  aerochamber plus with mask device 1 each ( Other Not Given 02/19/21 0321)    ED Course  I have reviewed the triage vital signs and the nursing notes.  Pertinent labs & imaging results that were available during my care of the patient were reviewed by me and considered in my medical decision making (see chart for details).    MDM Rules/Calculators/A&P                         Wheezing. Likely bronchitis. Pending covid test.  Covid negative. Stable for discharge.   Final Clinical Impression(s) / ED Diagnoses Final diagnoses:  Nasal congestion    Rx / DC Orders ED Discharge Orders    None       Josmar Messimer, Barbara Cower, MD 02/19/21 [phone]

## 2021-02-19 NOTE — ED Notes (Signed)
Patient verbalizes understanding of discharge instructions. Opportunity for questioning and answers were provided. Armband removed by staff, pt discharged from ED ambulatory.   

## 2021-02-20 LAB — HEPATITIS C ANTIBODY: Hep C Virus Ab: <0.1 s/co ratio (ref 0.0–0.9)

## 2021-02-20 LAB — CBC
Hematocrit: 28.5 % — ABNORMAL LOW (ref 34.0–46.6)
Hemoglobin: 9.6 g/dL — ABNORMAL LOW (ref 11.1–15.9)
MCH: 31.2 pg (ref 26.6–33.0)
MCHC: 33.7 g/dL (ref 31.5–35.7)
MCV: 93 fL (ref 79–97)
Platelets: 318 10*3/uL (ref 150–450)
RBC: 3.08 x10E6/uL — ABNORMAL LOW (ref 3.77–5.28)
RDW: 12.4 % (ref 11.7–15.4)
WBC: 6.2 10*3/uL (ref 3.4–10.8)

## 2021-02-20 LAB — RPR: RPR Ser Ql: NONREACTIVE

## 2021-02-20 LAB — RUBELLA ANTIBODY, IGM: Rubella IgM: <20 AU/mL (ref 0.0–19.9)

## 2021-02-20 LAB — ABO AND RH: Rh Factor: POSITIVE

## 2021-02-20 LAB — HEPATITIS B SURFACE ANTIGEN: Hepatitis B Surface Ag: NEGATIVE

## 2021-02-20 NOTE — Progress Notes (Signed)
History:   Kathryn Livingston is a 35 y.o. [id number] at 74w0dby LMP, early ultrasound being seen today for her first obstetrical visit.  Her obstetrical history is significant for advanced maternal age. Patient does intend to breast feed. Pregnancy history fully reviewed.  Patient reports no complaints.      HISTORY: OB History  Gravida Para Term Preterm AB Living  _0 0 1 4  SAB IAB Ectopic Multiple Live Births  1 0 0 0 4    # Outcome Date GA Lbr Len/2nd Weight Sex Delivery Anes PTL Lv  6 Current           5 SAB 2020          4 Term 04/19/15 [id number]:20 / 00:12 6 lb 8.2 oz (2.954 kg) F Vag-Spont EPI  LIV     Apgar1: 9  Apgar5: 9  3 Term 06/2007   5 lb 5 oz (2.41 kg) F Vag-Spont  N LIV  2 Term 09/2005   5 lb 4 oz (2.381 kg) F Vag-Spont  N LIV  1 Term 12/2003   5 lb 13 oz (2.637 kg) F Vag-Spont  N LIV    Last pap smear was done 2019 and was normal  Past Medical History:  Diagnosis Date  . Abnormal quad screen 02/26/2015   S/p InformaSeq with normal results.    . Alcohol abuse 04/11/2016  . Anemia in pregnancy 02/26/2015   Currently on iron supplementation   . Bipolar 1 disorder (HCSayre  . Breast abscess   . Depression    doing ok  . Headache   . Infection    UTI  . Low maternal weight gain   . Miscarriage 09/21/2018  . Obesity   . Schizophrenia ([place]CP[place]ear  . Tobacco use in pregnancy    Past Surgical History:  Procedure Laterality Date  . DILATION AND CURETTAGE OF UTERUS     Family History  Problem Relation Age of Onset  . Hypertension Mother   . Stroke Mother   . Diabetes Mother   . Hypertension Father   . Cancer Paternal Aunt   . Diabetes Maternal Aunt    Social History   Tobacco Use  . Smoking status: Current Some Day Smoker    Packs/day: 0.25    Years: 15.00    Pack years: 3.75    Types: Cigarettes  . Smokeless tobacco: Never Used  Vaping Use  . Vaping Use: Never used  Substance Use Topics  . Alcohol use: Not Currently    Comment: 1 beer  . Drug use:  Yes    Frequency: 7.0 times per week    Types: Marijuana   Allergies  Allergen Reactions  . Nicoderm [Nicotine]    Current Outpatient Medications on File Prior to Visit  Medication Sig Dispense Refill  . Prenatal Vit-Fe Fumarate-FA (PREPLUS) 27-1 MG TABS Take 1 tablet by mouth daily. 30 tablet 13  . promethazine (PHENERGAN) 12.5 MG tablet Take 1 tablet (12.5 mg total) by mouth every 6 (six) hours as needed for nausea or vomiting. 30 tablet 0   No current facility-administered medications on file prior to visit.    Review of Systems Pertinent items noted in HPI and remainder of comprehensive ROS otherwise negative. Physical Exam:   Vitals:   02/19/21 1404  BP: 112/66  Pulse: (!) 109  Weight: 209 lb 3.2 oz (94.9 kg)   Fetal Heart Rate (bpm): 174  Constitutional: Well-developed, well-nourished pregnant female in  no acute distress.  HEENT: PERRLA Skin: normal color and turgor, no rash Cardiovascular: normal rate & rhythm, no murmur Respiratory: normal effort, lung sounds clear throughout GI: Abd soft, non-tender, pos BS x 4, gravid appropriate for gestational age MS: Extremities nontender, no edema, normal ROM Neurologic: Alert and oriented x 4.  GU: no CVA tenderness Pelvic: NEFG, physiologic discharge, no blood, cervix clean. Pap/swabs collected  Assessment:    Pregnancy: [id number] Patient Active Problem List   Diagnosis Date Noted  . Supervision of high risk pregnancy, antepartum 02/19/2021  . Advanced maternal age in multigravida 02/19/2021  . Severe major depression, single episode, with psychotic features ([place]) 04/11/2016  . History of polyhydramnios 03/17/2015  . Continuous tobacco abuse 02/26/2015  . Marijuana use 02/26/2015     Plan:    1. Supervision of high risk pregnancy, antepartum Doing well, no complaints and not feeling fetal movement yet - CH[facility] AMB BABYSCRIPTS SCHEDULE OPTIMIZATION - Blood Pressure Monitoring (BLOOD PRESSURE KIT) DEVI; 1 Device by  Does not apply route once for 1 dose.  Dispense: 1 each; Refill: 0 - Genetic Screening - Cytology - PAP( [place]) - ABO AND RH  - Rubella Antibody, IgM - Hepatitis B Surface AntiGEN - Hepatitis C Antibody - RPR - CBC - [place] MFM OB DETAIL +14 WK; Future - Culture, OB Urine  2. Multigravida of advanced maternal age in first trimester - Adjusting to unexpected pregnancy well  3. [redacted] weeks gestation of pregnancy Routine new OB care including: - Initial labs drawn. - Continue prenatal vitamins. - Problem list reviewed and updated. - Genetic Screening discussed, First trimester screen, Quad screen and NIPS: ordered. - Ultrasound discussed; fetal anatomic survey: ordered. - Anticipatory guidance about prenatal visits given including labs, ultrasounds, and testing. - Discussed usage of Babyscripts and virtual visits as additional source of managing and completing prenatal visits in midst of coronavirus and pandemic.   - Encouraged to complete MyChart Registration for her ability to review results, send requests, and have questions addressed.  - The nature of [place] for [facility] with multiple MDs and Advanced Practice Providers was explained to patient; also emphasized that residents, students are part of our team. - Routine obstetric precautions reviewed. Encouraged to seek out care at office or emergency room [facility] preferred) for urgent and/or emergent concerns. Return in about 4 weeks (around 03/19/2021) for IN-PERSON, [place].     Gaylan Gerold, MSN, CNM, [place] Certified Nurse Midwife, [facility]

## 2021-02-21 LAB — CULTURE, OB URINE

## 2021-02-21 LAB — URINE CULTURE, OB REFLEX

## 2021-02-23 ENCOUNTER — Other Ambulatory Visit: Payer: Self-pay

## 2021-02-23 DIAGNOSIS — O210 Mild hyperemesis gravidarum: Secondary | ICD-10-CM

## 2021-02-24 ENCOUNTER — Encounter: Payer: Self-pay | Admitting: *Deleted

## 2021-02-24 MED ORDER — PROMETHAZINE HCL 12.5 MG PO TABS: 12.5000 mg | ORAL_TABLET | Freq: Four times a day (QID) | ORAL | 0 refills | Status: DC | PRN

## 2021-02-25 ENCOUNTER — Telehealth: Payer: Self-pay | Admitting: *Deleted

## 2021-02-25 LAB — CYTOLOGY - PAP
Chlamydia: NEGATIVE
Comment: NEGATIVE
Comment: NEGATIVE
Comment: NEGATIVE
Comment: NORMAL
Diagnosis: UNDETERMINED — AB
High risk HPV: NEGATIVE
Neisseria Gonorrhea: NEGATIVE
Trichomonas: POSITIVE — AB

## 2021-02-25 MED ORDER — POLYSACCHARIDE IRON COMPLEX 150 MG PO CAPS: 150.0000 mg | ORAL_CAPSULE | Freq: Every day | ORAL | 4 refills | Status: DC

## 2021-02-25 MED ORDER — METRONIDAZOLE 500 MG PO TABS: 500.0000 mg | ORAL_TABLET | Freq: Two times a day (BID) | ORAL | 0 refills | Status: DC

## 2021-02-25 NOTE — Telephone Encounter (Signed)
I called Kathryn Livingston to discuss her second blood pressure that we asked her to take via [employer]. I left a message I was calling about her blood pressure and to please recheck and send MyChart message. Yarixa Lightcap,RN

## 2021-02-25 NOTE — Addendum Note (Signed)
Addended by: Edd Arbour on: 02/25/2021 09:15 PM   Modules accepted: Orders

## 2021-03-04 ENCOUNTER — Encounter: Payer: Self-pay | Admitting: *Deleted

## 2021-03-19 ENCOUNTER — Encounter: Payer: Self-pay | Admitting: Family Medicine

## 2021-03-19 ENCOUNTER — Other Ambulatory Visit: Payer: Self-pay

## 2021-03-19 ENCOUNTER — Ambulatory Visit (INDEPENDENT_AMBULATORY_CARE_PROVIDER_SITE_OTHER): Payer: Medicaid Other | Admitting: Family Medicine

## 2021-03-19 VITALS — BP 103/68 | HR 97 | Wt 210.0 lb

## 2021-03-19 DIAGNOSIS — F323 Major depressive disorder, single episode, severe with psychotic features: Secondary | ICD-10-CM

## 2021-03-19 DIAGNOSIS — Z8759 Personal history of other complications of pregnancy, childbirth and the puerperium: Secondary | ICD-10-CM

## 2021-03-19 DIAGNOSIS — F1290 Cannabis use, unspecified, uncomplicated: Secondary | ICD-10-CM

## 2021-03-19 DIAGNOSIS — O0990 Supervision of high risk pregnancy, unspecified, unspecified trimester: Secondary | ICD-10-CM

## 2021-03-19 DIAGNOSIS — O09522 Supervision of elderly multigravida, second trimester: Secondary | ICD-10-CM

## 2021-03-19 DIAGNOSIS — O99019 Anemia complicating pregnancy, unspecified trimester: Secondary | ICD-10-CM

## 2021-03-19 DIAGNOSIS — Z720 Tobacco use: Secondary | ICD-10-CM

## 2021-03-19 MED ORDER — PREPLUS 27-1 MG PO TABS: 1.0000 | ORAL_TABLET | Freq: Every day | ORAL | 13 refills | Status: AC

## 2021-03-19 MED ORDER — ONDANSETRON 4 MG PO TBDP: 4.0000 mg | ORAL_TABLET | Freq: Four times a day (QID) | ORAL | 0 refills | Status: DC | PRN

## 2021-03-19 MED ORDER — FERROUS SULFATE 325 (65 FE) MG PO TABS: 325.0000 mg | ORAL_TABLET | Freq: Two times a day (BID) | ORAL | 1 refills | Status: AC

## 2021-03-19 MED ORDER — DOXYLAMINE-PYRIDOXINE 10-10 MG PO TBEC: 2.0000 | DELAYED_RELEASE_TABLET | Freq: Every day | ORAL | 5 refills | Status: DC

## 2021-03-19 NOTE — Patient Instructions (Signed)

## 2021-03-19 NOTE — Progress Notes (Deleted)
   PRENATAL VISIT NOTE  Subjective:  Kathryn Livingston is a 35 y.o. [id number] at [redacted]w[redacted]d being seen today for ongoing prenatal care.  She is currently monitored for the following issues for this {Blank single:19197::"high-risk","low-risk"} pregnancy and has Continuous tobacco abuse; Marijuana use; History of polyhydramnios; Severe major depression, single episode, with psychotic features (HCC); Supervision of high risk pregnancy, antepartum; and Advanced maternal age in multigravida on their problem list.  Patient reports {sx:[id number]}.  Contractions: Not present. Vag. Bleeding: None.  Movement: Present. Denies leaking of fluid.   The following portions of the patient's history were reviewed and updated as appropriate: allergies, current medications, past family history, past medical history, past social history, past surgical history and problem list.   Objective:   Vitals:   03/19/21 1350  BP: 103/68  Pulse: 97  Weight: 210 lb (95.3 kg)    Fetal Status: Fetal Heart Rate (bpm): 166   Movement: Present     General:  Alert, oriented and cooperative. Patient is in no acute distress.  Skin: Skin is warm and dry. No rash noted.   Cardiovascular: Normal heart rate noted  Respiratory: Normal respiratory effort, no problems with respiration noted  Abdomen: Soft, gravid, appropriate for gestational age.  Pain/Pressure: Present     Pelvic: {Blank single:19197::"Cervical exam performed in the presence of a chaperone","Cervical exam deferred"}        Extremities: Normal range of motion.  Edema: None  Mental Status: Normal mood and affect. Normal behavior. Normal judgment and thought content.   Assessment and Plan:  Pregnancy: [id number] at [redacted]w[redacted]d 1. Supervision of high risk pregnancy, antepartum  - AFP, Serum, Open Spina Bifida  2. Multigravida of advanced maternal age in second trimester   3. Severe major depression, single episode, with psychotic features (HCC)   4. History of  polyhydramnios   5. Continuous tobacco abuse   6. Marijuana use   {Blank single:19197::"Term","Preterm"} labor symptoms and general obstetric precautions including but not limited to vaginal bleeding, contractions, leaking of fluid and fetal movement were reviewed in detail with the patient. Please refer to After Visit Summary for other counseling recommendations.   No follow-ups on file.  Future Appointments  Date Time Provider Department Center  04/10/2021 12:45 PM [facility] NURSE [facility]  04/10/2021  1:00 PM [facility] US1 [facility]    Federico Flake, MD

## 2021-03-19 NOTE — Progress Notes (Signed)
PRENATAL VISIT NOTE  Subjective:  Kathryn Livingston is a 35 y.o. [id number] at [redacted]w[redacted]d being seen today for ongoing prenatal care.  She is currently monitored for the following issues for this high-risk pregnancy and has Continuous tobacco abuse; Marijuana use; History of polyhydramnios; Severe major depression, single episode, with psychotic features (HCC); Supervision of high risk pregnancy, antepartum; Advanced maternal age in multigravida; and Anemia during pregnancy on their problem list.  Patient reports having nausea everyday. She notes that phenergan causes her to feel drowsiness during the day. She endorses that she has been fatigued also due her periodic sleepless nights. She has headaches everyday, but denies taking any analgesics. Denies any vision changes. Notes having good bowel movements, with no bouts of constipation.  Contractions: Not present. Vag. Bleeding: None.  Movement: Present. Denies leaking of fluid.   The following portions of the patient's history were reviewed and updated as appropriate: allergies, current medications, past family history, past medical history, past social history, past surgical history and problem list.   Objective:   Vitals:   03/19/21 1350  BP: 103/68  Pulse: 97  Weight: 210 lb (95.3 kg)    Fetal Status: Fetal Heart Rate (bpm): 166   Movement: Present     General:  Alert, oriented and cooperative. Patient is in no acute distress.  Skin: Skin is warm and dry. No rash noted.   Cardiovascular: Normal heart rate noted  Respiratory: Normal respiratory effort, no problems with respiration noted  Abdomen: Soft, gravid, appropriate for gestational age.  Pain/Pressure: Present     Pelvic: Cervical exam deferred        Extremities: Normal range of motion.  Edema: None  Mental Status: Normal mood and affect. Normal behavior. Normal judgment and thought content.   Assessment and Plan:  Pregnancy: [id number] at [redacted]w[redacted]d 1. Supervision of high risk pregnancy,  antepartum Patient was advised to continue with routine OB care. Patient having nausea that is comfortable, reviewed methods to reduce N/V.  - AFP, Serum, Open Spina Bifida - Prenatal Vit-Fe Fumarate-FA (PREPLUS) 27-1 MG TABS; Take 1 tablet by mouth daily.  Dispense: 30 tablet; Refill: 13 - ondansetron (ZOFRAN ODT) 4 MG disintegrating tablet; Take 1 tablet (4 mg total) by mouth every 6 (six) hours as needed for nausea.  Dispense: 20 tablet; Refill: 0 - Doxylamine-Pyridoxine (DICLEGIS) 10-10 MG TBEC; Take 2 tablets by mouth at bedtime. If symptoms persist, add one tablet in the morning and one in the afternoon  Dispense: 100 tablet; Refill: 5 - ferrous sulfate (FERROUSUL) 325 (65 FE) MG tablet; Take 1 tablet (325 mg total) by mouth 2 (two) times daily.  Dispense: 60 tablet; Refill: 1  2. Multigravida of advanced maternal age in second trimester -Adjusting to pregnancy well.   3. Anemia - taking Fe sulfate Discussed use of blood builder supplement Repeat at next visit If not improved consider IV iron  Preterm labor symptoms and general obstetric precautions including but not limited to vaginal bleeding, contractions, leaking of fluid and fetal movement were reviewed in detail with the patient. Please refer to After Visit Summary for other counseling recommendations.   Return in about 4 weeks (around 04/16/2021) for Routine prenatal care.  Future Appointments  Date Time Provider Department Center  04/10/2021 12:45 PM [facility] NURSE [facility]  04/10/2021  1:00 PM [facility] US1 [facility]  04/16/2021  3:15 PM Constant, Gigi Gin, MD [facility]   Documentation and visit was performed with PA Student Oren Binet  Attestation of Attending  Supervision of Student:  I confirm that I have verified the information documented in the physician assistant student's note and that I have also personally reperformed the history, physical exam and all medical decision making activities.  I have verified that  all services and findings are accurately documented in this student's note; and I agree with management and plan as outlined in the documentation.   Federico Flake, MD

## 2021-03-21 LAB — AFP, SERUM, OPEN SPINA BIFIDA
AFP MoM: 1.02
AFP Value: 30.1 ng/mL
Gest. Age on Collection Date: 15.6 weeks
Maternal Age At EDD: 37.2 yr
OSBR Risk 1 IN: 10000
Test Results:: NEGATIVE
Weight: 210 [lb_av]

## 2021-03-23 DIAGNOSIS — O99019 Anemia complicating pregnancy, unspecified trimester: Secondary | ICD-10-CM | POA: Insufficient documentation

## 2021-03-26 ENCOUNTER — Inpatient Hospital Stay (HOSPITAL_COMMUNITY)
Admission: AD | Admit: 2021-03-26 | Discharge: 2021-03-26 | Discharge status: Against Medical Advice | Payer: Medicaid Other | Attending: Obstetrics & Gynecology | Admitting: Obstetrics & Gynecology

## 2021-03-26 ENCOUNTER — Other Ambulatory Visit: Payer: Self-pay

## 2021-03-26 DIAGNOSIS — O99019 Anemia complicating pregnancy, unspecified trimester: Secondary | ICD-10-CM

## 2021-03-26 DIAGNOSIS — O09522 Supervision of elderly multigravida, second trimester: Secondary | ICD-10-CM

## 2021-03-26 DIAGNOSIS — O0990 Supervision of high risk pregnancy, unspecified, unspecified trimester: Secondary | ICD-10-CM

## 2021-03-26 DIAGNOSIS — Z5321 Procedure and treatment not carried out due to patient leaving prior to being seen by health care provider: Secondary | ICD-10-CM | POA: Diagnosis present

## 2021-03-26 DIAGNOSIS — K5900 Constipation, unspecified: Secondary | ICD-10-CM | POA: Diagnosis not present

## 2021-03-26 NOTE — Progress Notes (Signed)
Pt called again, not in lobby.

## 2021-03-26 NOTE — Progress Notes (Signed)
Pt called to be put into room, no answer and sitting in waiting area

## 2021-03-26 NOTE — MAU Note (Signed)
Presents with c/o constipation.  States hasn't had a BM for a few days.  Hasn't attempted any med to assist with having BM. Denies VB or LOF.

## 2021-03-26 NOTE — Progress Notes (Signed)
Pt called a 3rd time, still not in lobby.  Left MAU prior to being seen.

## 2021-04-10 ENCOUNTER — Other Ambulatory Visit: Payer: Medicaid Other

## 2021-04-10 ENCOUNTER — Ambulatory Visit: Payer: Medicaid Other

## 2021-04-16 ENCOUNTER — Ambulatory Visit (INDEPENDENT_AMBULATORY_CARE_PROVIDER_SITE_OTHER): Payer: Medicaid Other | Admitting: Obstetrics and Gynecology

## 2021-04-16 ENCOUNTER — Encounter: Payer: Self-pay | Admitting: Obstetrics and Gynecology

## 2021-04-16 VITALS — BP 117/67 | HR 104 | Wt 208.3 lb

## 2021-04-16 DIAGNOSIS — Z5941 Food insecurity: Secondary | ICD-10-CM

## 2021-04-16 DIAGNOSIS — O0990 Supervision of high risk pregnancy, unspecified, unspecified trimester: Secondary | ICD-10-CM

## 2021-04-16 DIAGNOSIS — O99019 Anemia complicating pregnancy, unspecified trimester: Secondary | ICD-10-CM

## 2021-04-16 DIAGNOSIS — D563 Thalassemia minor: Secondary | ICD-10-CM | POA: Insufficient documentation

## 2021-04-16 MED ORDER — DOCUSATE SODIUM 100 MG PO CAPS
100.0000 mg | ORAL_CAPSULE | Freq: Two times a day (BID) | ORAL | 0 refills | Status: DC
Start: 2021-04-16 — End: 2021-05-21

## 2021-04-16 NOTE — Progress Notes (Signed)
error 

## 2021-04-16 NOTE — Progress Notes (Signed)
   PRENATAL VISIT NOTE  Subjective:  Kathryn Livingston is a 35 y.o. [id number] at [redacted]w[redacted]d being seen today for ongoing prenatal care.  She is currently monitored for the following issues for this low-risk pregnancy and has Continuous tobacco abuse; Marijuana use; History of polyhydramnios; Severe major depression, single episode, with psychotic features (HCC); Supervision of high risk pregnancy, antepartum; Advanced maternal age in multigravida; Anemia during pregnancy; and Alpha thalassemia silent carrier on their problem list.  Patient reports headache.  Contractions: Not present.  .  Movement: Present. Denies leaking of fluid.   The following portions of the patient's history were reviewed and updated as appropriate: allergies, current medications, past family history, past medical history, past social history, past surgical history and problem list.   Objective:   Vitals:   04/16/21 1530  BP: 117/67  Pulse: (!) 104  Weight: 208 lb 4.8 oz (94.5 kg)    Fetal Status: Fetal Heart Rate (bpm): 166   Movement: Present     General:  Alert, oriented and cooperative. Patient is in no acute distress.  Skin: Skin is warm and dry. No rash noted.   Cardiovascular: Normal heart rate noted  Respiratory: Normal respiratory effort, no problems with respiration noted  Abdomen: Soft, gravid, appropriate for gestational age.  Pain/Pressure: Absent     Pelvic: Cervical exam deferred        Extremities: Normal range of motion.  Edema: Trace  Mental Status: Normal mood and affect. Normal behavior. Normal judgment and thought content.   Assessment and Plan:  Pregnancy: [id number] at [redacted]w[redacted]d 1. Supervision of high risk pregnancy, antepartum Patient is doing well complaining of headache induced by heat and constipation Advised to increase water and fiber intake. Rx colace provided Follow up anatomy ultrasound scheduled 8/11  2. Anemia during pregnancy Continue iron supplement   Preterm labor symptoms and  general obstetric precautions including but not limited to vaginal bleeding, contractions, leaking of fluid and fetal movement were reviewed in detail with the patient. Please refer to After Visit Summary for other counseling recommendations.   Return in about 4 weeks (around 05/14/2021) for in person, ROB, Low risk.  Future Appointments  Date Time Provider Department Center  05/07/2021 10:00 AM [facility] NURSE [facility]  05/07/2021 10:15 AM [facility] US2 [facility]    Catalina Antigua, MD

## 2021-04-16 NOTE — Progress Notes (Signed)
Patient complains of daily headaches and occasional "black speckles" in vision

## 2021-04-20 ENCOUNTER — Inpatient Hospital Stay (HOSPITAL_COMMUNITY)
Admission: AD | Admit: 2021-04-20 | Discharge: 2021-04-20 | Disposition: A | Discharge status: Home / Self Care | Payer: Medicaid Other | Attending: Obstetrics & Gynecology | Admitting: Obstetrics & Gynecology

## 2021-04-20 ENCOUNTER — Encounter (HOSPITAL_COMMUNITY): Payer: Self-pay | Admitting: Obstetrics & Gynecology

## 2021-04-20 ENCOUNTER — Other Ambulatory Visit: Payer: Self-pay

## 2021-04-20 ENCOUNTER — Other Ambulatory Visit: Payer: Self-pay | Admitting: Family Medicine

## 2021-04-20 DIAGNOSIS — O99512 Diseases of the respiratory system complicating pregnancy, second trimester: Secondary | ICD-10-CM | POA: Diagnosis not present

## 2021-04-20 DIAGNOSIS — O26892 Other specified pregnancy related conditions, second trimester: Secondary | ICD-10-CM | POA: Diagnosis present

## 2021-04-20 DIAGNOSIS — Z3A20 20 weeks gestation of pregnancy: Secondary | ICD-10-CM | POA: Diagnosis not present

## 2021-04-20 DIAGNOSIS — Z20822 Contact with and (suspected) exposure to covid-19: Secondary | ICD-10-CM | POA: Insufficient documentation

## 2021-04-20 DIAGNOSIS — F17210 Nicotine dependence, cigarettes, uncomplicated: Secondary | ICD-10-CM | POA: Insufficient documentation

## 2021-04-20 DIAGNOSIS — O99323 Drug use complicating pregnancy, third trimester: Secondary | ICD-10-CM | POA: Diagnosis not present

## 2021-04-20 DIAGNOSIS — Z8249 Family history of ischemic heart disease and other diseases of the circulatory system: Secondary | ICD-10-CM | POA: Diagnosis not present

## 2021-04-20 DIAGNOSIS — O99332 Smoking (tobacco) complicating pregnancy, second trimester: Secondary | ICD-10-CM | POA: Diagnosis not present

## 2021-04-20 DIAGNOSIS — F1290 Cannabis use, unspecified, uncomplicated: Secondary | ICD-10-CM | POA: Insufficient documentation

## 2021-04-20 DIAGNOSIS — R42 Dizziness and giddiness: Secondary | ICD-10-CM | POA: Insufficient documentation

## 2021-04-20 DIAGNOSIS — D509 Iron deficiency anemia, unspecified: Secondary | ICD-10-CM | POA: Diagnosis not present

## 2021-04-20 DIAGNOSIS — J219 Acute bronchiolitis, unspecified: Secondary | ICD-10-CM | POA: Diagnosis not present

## 2021-04-20 DIAGNOSIS — O99012 Anemia complicating pregnancy, second trimester: Secondary | ICD-10-CM | POA: Diagnosis not present

## 2021-04-20 DIAGNOSIS — B9789 Other viral agents as the cause of diseases classified elsewhere: Secondary | ICD-10-CM

## 2021-04-20 DIAGNOSIS — R519 Headache, unspecified: Secondary | ICD-10-CM | POA: Insufficient documentation

## 2021-04-20 DIAGNOSIS — J40 Bronchitis, not specified as acute or chronic: Secondary | ICD-10-CM | POA: Diagnosis not present

## 2021-04-20 DIAGNOSIS — J218 Acute bronchiolitis due to other specified organisms: Secondary | ICD-10-CM

## 2021-04-20 LAB — BASIC METABOLIC PANEL
Anion gap: 9 (ref 5–15)
BUN: <5 mg/dL — ABNORMAL LOW (ref 6–20)
CO2: 21 mmol/L — ABNORMAL LOW (ref 22–32)
Calcium: 8.9 mg/dL (ref 8.9–10.3)
Chloride: 105 mmol/L (ref 98–111)
Creatinine, Ser: 0.53 mg/dL (ref 0.44–1.00)
GFR, Estimated: >60 mL/min (ref >60)
Glucose, Bld: 86 mg/dL (ref 70–99)
Potassium: 3.3 mmol/L — ABNORMAL LOW (ref 3.5–5.1)
Sodium: 135 mmol/L (ref 135–145)

## 2021-04-20 LAB — URINALYSIS, ROUTINE W REFLEX MICROSCOPIC
Bacteria, UA: NONE SEEN
Bilirubin Urine: NEGATIVE
Glucose, UA: NEGATIVE mg/dL
Ketones, ur: NEGATIVE mg/dL
Leukocytes,Ua: NEGATIVE
Nitrite: NEGATIVE
Protein, ur: NEGATIVE mg/dL
Specific Gravity, Urine: 1.006 (ref 1.005–1.030)
pH: 6 (ref 5.0–8.0)

## 2021-04-20 LAB — RESP PANEL BY RT-PCR (FLU A&B, COVID) ARPGX2
Influenza A by PCR: NEGATIVE
Influenza B by PCR: NEGATIVE
SARS Coronavirus 2 by RT PCR: NEGATIVE

## 2021-04-20 LAB — HEMOGLOBIN AND HEMATOCRIT, BLOOD
HCT: 28.3 % — ABNORMAL LOW (ref 36.0–46.0)
Hemoglobin: 9.2 g/dL — ABNORMAL LOW (ref 12.0–15.0)

## 2021-04-20 MED ORDER — ACETAMINOPHEN 500 MG PO TABS
1000.0000 mg | ORAL_TABLET | Freq: Once | ORAL | Status: AC
  Administered 2021-04-20: 1000 mg via ORAL
  Filled 2021-04-20: qty 2

## 2021-04-20 MED ORDER — CYCLOBENZAPRINE HCL 5 MG PO TABS
10.0000 mg | ORAL_TABLET | Freq: Once | ORAL | Status: AC
  Administered 2021-04-20: 10 mg via ORAL
  Filled 2021-04-20: qty 2

## 2021-04-20 NOTE — MAU Provider Note (Addendum)
History     CSN: [id number]  Arrival date and time: 04/20/21 6440   Event Date/Time   First Provider Initiated Contact with Patient 04/20/21 [id number]      Chief Complaint  Patient presents with   Headache   HPI Kathryn Livingston is a 35 y.o. [id number] at [redacted]w[redacted]d who presents via EMS for headache and dizziness.  Symptoms started last week.  She is currently prescribed iron supplements due to anemia, but has not taken them in 3 days due to constipation side effect.  Currently reports a frontal headache that is a 10 out of 10.  Nothing makes better or worse.  Has not treated symptoms.  Reports occasional dizziness with no precipitating factors.  No syncopal episodes.  She also reports a mild cough this week with small amount of clear mucoid production.  Denies sore throat, chest pain, or shortness of breath.  Denies any sick contacts.  Denies nausea, vomiting, diarrhea, abdominal pain, or vaginal bleeding.  OB History     Gravida  6   Para  4   Term  4   Preterm  0   AB  1   Living  4      SAB  1   IAB  0   Ectopic  0   Multiple  0   Live Births  4           Past Medical History:  Diagnosis Date   Alcohol abuse 04/11/2016   Bipolar 1 disorder (HCC)    Breast abscess    Depression    doing ok   Headache    Low maternal weight gain    Obesity    Schizophrenia (HCC)     Past Surgical History:  Procedure Laterality Date   DILATION AND CURETTAGE OF UTERUS      Family History  Problem Relation Age of Onset   Hypertension Mother    Stroke Mother    Diabetes Mother    Hypertension Father    Cancer Paternal Aunt    Diabetes Maternal Aunt     Social History   Tobacco Use   Smoking status: Some Days    Packs/day: 0.25    Years: 15.00    Pack years: 3.75    Types: Cigarettes   Smokeless tobacco: Never  Vaping Use   Vaping Use: Never used  Substance Use Topics   Alcohol use: Not Currently    Comment: 1 beer   Drug use: Yes    Frequency: 7.0 times per  week    Types: Marijuana    Allergies:  Allergies  Allergen Reactions   Nicoderm [Nicotine]     Medications Prior to Admission  Medication Sig Dispense Refill Last Dose   docusate sodium (COLACE) 100 MG capsule Take 1 capsule (100 mg total) by mouth 2 (two) times daily. 10 capsule 0 04/19/2021   ferrous sulfate (FERROUSUL) 325 (65 FE) MG tablet Take 1 tablet (325 mg total) by mouth 2 (two) times daily. 60 tablet 1 Past Week   ondansetron (ZOFRAN ODT) 4 MG disintegrating tablet Take 1 tablet (4 mg total) by mouth every 6 (six) hours as needed for nausea. 20 tablet 0 04/19/2021   Prenatal Vit-Fe Fumarate-FA (PREPLUS) 27-1 MG TABS Take 1 tablet by mouth daily. 30 tablet 13 04/19/2021   Doxylamine-Pyridoxine (DICLEGIS) 10-10 MG TBEC Take 2 tablets by mouth at bedtime. If symptoms persist, add one tablet in the morning and one in the afternoon (Patient not taking: Reported on  04/16/2021) 100 tablet 5    metroNIDAZOLE (FLAGYL) 500 MG tablet Take 1 tablet (500 mg total) by mouth 2 (two) times daily. (Patient not taking: Reported on 04/16/2021) 14 tablet 0    promethazine (PHENERGAN) 12.5 MG tablet Take 1 tablet (12.5 mg total) by mouth every 6 (six) hours as needed for nausea or vomiting. (Patient not taking: Reported on 04/16/2021) 30 tablet 0     Review of Systems  Constitutional: Negative.   HENT: Negative.    Respiratory:  Positive for cough. Negative for shortness of breath.   Cardiovascular:  Negative for chest pain.  Gastrointestinal:  Positive for constipation. Negative for diarrhea, nausea and vomiting.  Genitourinary: Negative.   Neurological:  Positive for light-headedness and headaches. Negative for syncope.  Physical Exam   Blood pressure 120/82, pulse 85, temperature 98 F (36.7 C), temperature source Oral, resp. rate 18, height 5\' 7"  (1.702 m), last menstrual period 11/28/2020, SpO2 100 %, unknown if currently breastfeeding.  Orthostatic VS for the past 24 hrs:  BP- Lying Pulse-  Lying BP- Sitting Pulse- Sitting BP- Standing at 0 minutes Pulse- Standing at 0 minutes  04/20/21 0734 -- -- -- -- 125/62 96  04/20/21 0733 -- -- 120/76 76 -- --  04/20/21 0731 127/62 86 -- -- -- --     Physical Exam Constitutional:      General: She is not in acute distress.    Appearance: She is well-developed. She is not ill-appearing.  HENT:     Head: Normocephalic and atraumatic.  Cardiovascular:     Rate and Rhythm: Normal rate and regular rhythm.     Heart sounds: Normal heart sounds.  Pulmonary:     Effort: Pulmonary effort is normal. No respiratory distress.     Breath sounds: Normal breath sounds. No wheezing.  Skin:    General: Skin is warm.  Neurological:     Mental Status: She is alert.  Psychiatric:        Mood and Affect: Mood normal.        Speech: Speech normal.        Behavior: Behavior normal.    MAU Course  Procedures Results for orders placed or performed during the hospital encounter of 04/20/21 (from the past 24 hour(s))  Urinalysis, Routine w reflex microscopic     Status: Abnormal   Collection Time: 04/20/21  6:32 AM  Result Value Ref Range   Color, Urine YELLOW YELLOW   APPearance CLEAR CLEAR   Specific Gravity, Urine 1.006 1.005 - 1.030   pH 6.0 5.0 - 8.0   Glucose, UA NEGATIVE NEGATIVE mg/dL   Hgb urine dipstick SMALL (A) NEGATIVE   Bilirubin Urine NEGATIVE NEGATIVE   Ketones, ur NEGATIVE NEGATIVE mg/dL   Protein, ur NEGATIVE NEGATIVE mg/dL   Nitrite NEGATIVE NEGATIVE   Leukocytes,Ua NEGATIVE NEGATIVE   RBC / HPF 0-5 0 - 5 RBC/hpf   WBC, UA 0-5 0 - 5 WBC/hpf   Bacteria, UA NONE SEEN NONE SEEN   Squamous Epithelial / LPF 0-5 0 - 5  Resp Panel by RT-PCR (Flu A&B, Covid) Nasopharyngeal Swab     Status: None   Collection Time: 04/20/21  6:36 AM   Specimen: Nasopharyngeal Swab; Nasopharyngeal(NP) swabs in vial transport medium  Result Value Ref Range   SARS Coronavirus 2 by RT PCR NEGATIVE NEGATIVE   Influenza A by PCR NEGATIVE NEGATIVE    Influenza B by PCR NEGATIVE NEGATIVE  Hemoglobin and hematocrit, blood     Status: Abnormal  Collection Time: 04/20/21  7:08 AM  Result Value Ref Range   Hemoglobin 9.2 (L) 12.0 - 15.0 g/dL   HCT 20.2 (L) 54.2 - 70.6 %  Basic metabolic panel     Status: Abnormal   Collection Time: 04/20/21  7:08 AM  Result Value Ref Range   Sodium 135 135 - 145 mmol/L   Potassium 3.3 (L) 3.5 - 5.1 mmol/L   Chloride 105 98 - 111 mmol/L   CO2 21 (L) 22 - 32 mmol/L   Glucose, Bld 86 70 - 99 mg/dL   BUN <5 (L) 6 - 20 mg/dL   Creatinine, Ser 2.37 0.44 - 1.00 mg/dL   Calcium 8.9 8.9 - 62.8 mg/dL   GFR, Estimated >31 >51 mL/min   Anion gap 9 5 - 15   No results found.  MDM FHT present via doppler Orthostatic VS normal H/H & U/a pending Tylenol & flexeril for headache  Care turned over to Dr. Derrill Memo, Denny Peon, NP 04/20/2021 7:57 AM   Headache improved. COVID/Flu neg. No white count so not pneumonia. Likely viral bronchitis.   Assessment and Plan   1. [redacted] weeks gestation of pregnancy   2. Iron deficiency anemia of pregnancy   3. Acute viral bronchiolitis    Supportive care for bronchitis. IV iron infusion ordered - will get patient set up for outpatient infusions.  Return with worsening condition.

## 2021-04-20 NOTE — MAU Note (Signed)
OVETTA BAZZANO is a 35 y.o. at [redacted]w[redacted]d here in MAU reporting: of headaches starting "weeks ago" causing pt to be dizzy and lightheaded, which started today around 0500. Pt states, she just doesn't feel right." Denies abnormal vaginal discharge or bleeding. +FM Onset of complaint: 04/20/2021 @0500  Pain score: 10/10 Vitals:   04/20/21 0620  BP: 120/82  Pulse: 85  Resp: 18  Temp: 98 F (36.7 C)  SpO2: 100%     FHT:165 Lab orders placed from triage: UA

## 2021-04-22 ENCOUNTER — Telehealth: Payer: Self-pay

## 2021-04-22 NOTE — Telephone Encounter (Addendum)
-----   Message from Levie Heritage, DO sent at 04/20/2021  9:39 AM EDT ----- Regarding: Iron infusions Please schedule the patient for iron infusions weekly x 2 doses  Scheduled Venofer for 05/01/21 @ 0800 at Short Stay.  Notified pt of appt and that she will be there approximately 2 hrs.  Pt verbalized understanding.  Addison Naegeli, RN  04/22/21

## 2021-05-01 ENCOUNTER — Encounter (HOSPITAL_COMMUNITY)
Admission: RE | Admit: 2021-05-01 | Discharge: 2021-05-01 | Disposition: A | Discharge status: Home / Self Care | Payer: Medicaid Other | Source: Ambulatory Visit | Attending: Family Medicine | Admitting: Family Medicine

## 2021-05-01 ENCOUNTER — Other Ambulatory Visit: Payer: Self-pay

## 2021-05-01 DIAGNOSIS — D509 Iron deficiency anemia, unspecified: Secondary | ICD-10-CM | POA: Insufficient documentation

## 2021-05-01 DIAGNOSIS — O99019 Anemia complicating pregnancy, unspecified trimester: Secondary | ICD-10-CM | POA: Insufficient documentation

## 2021-05-01 MED ORDER — SODIUM CHLORIDE 0.9 % IV SOLN
300.0000 mg | INTRAVENOUS | Status: DC
  Filled 2021-05-01: qty 15

## 2021-05-01 MED ORDER — DIPHENHYDRAMINE HCL 50 MG/ML IJ SOLN: 25.0000 mg | Freq: Once | INTRAMUSCULAR | Status: DC | PRN

## 2021-05-01 MED ORDER — ACETAMINOPHEN 500 MG PO TABS
ORAL_TABLET | ORAL | Status: AC
  Administered 2021-05-01: 1000 mg via ORAL
  Filled 2021-05-01: qty 2

## 2021-05-01 MED ORDER — SODIUM CHLORIDE 0.9 % IV SOLN: INTRAVENOUS | Status: DC | PRN

## 2021-05-01 MED ORDER — ACETAMINOPHEN 500 MG PO TABS
1000.0000 mg | ORAL_TABLET | Freq: Once | ORAL | Status: DC
Start: 2021-05-01 — End: 2021-05-01

## 2021-05-01 MED ORDER — LORATADINE 10 MG PO TABS
10.0000 mg | ORAL_TABLET | Freq: Once | ORAL | Status: DC
  Administered 2021-05-01: 10 mg via ORAL

## 2021-05-01 MED ORDER — LORATADINE 10 MG PO TABS
ORAL_TABLET | ORAL | Status: AC
  Filled 2021-05-01: qty 1

## 2021-05-01 MED ORDER — METHYLPREDNISOLONE SODIUM SUCC 125 MG IJ SOLR: 125.0000 mg | Freq: Once | INTRAMUSCULAR | Status: DC | PRN

## 2021-05-01 MED ORDER — SODIUM CHLORIDE 0.9 % IV SOLN
300.0000 mg | INTRAVENOUS | Status: DC
  Administered 2021-05-01: 300 mg via INTRAVENOUS
  Filled 2021-05-01: qty 15

## 2021-05-01 MED ORDER — EPINEPHRINE PF 1 MG/ML IJ SOLN: 0.3000 mg | Freq: Once | INTRAMUSCULAR | Status: DC | PRN

## 2021-05-01 MED ORDER — ALBUTEROL SULFATE (2.5 MG/3ML) 0.083% IN NEBU: 2.5000 mg | INHALATION_SOLUTION | Freq: Once | RESPIRATORY_TRACT | Status: DC | PRN

## 2021-05-01 MED ORDER — SODIUM CHLORIDE 0.9 % IV BOLUS: 500.0000 mL | Freq: Once | INTRAVENOUS | Status: DC | PRN

## 2021-05-07 ENCOUNTER — Other Ambulatory Visit: Payer: Self-pay | Admitting: Obstetrics

## 2021-05-07 ENCOUNTER — Ambulatory Visit: Payer: Medicaid Other | Attending: Obstetrics and Gynecology | Admitting: *Deleted

## 2021-05-07 ENCOUNTER — Other Ambulatory Visit: Payer: Self-pay

## 2021-05-07 ENCOUNTER — Ambulatory Visit (HOSPITAL_BASED_OUTPATIENT_CLINIC_OR_DEPARTMENT_OTHER): Payer: Medicaid Other

## 2021-05-07 ENCOUNTER — Encounter: Payer: Self-pay | Admitting: *Deleted

## 2021-05-07 VITALS — BP 112/59 | HR 92

## 2021-05-07 DIAGNOSIS — Z3A22 22 weeks gestation of pregnancy: Secondary | ICD-10-CM | POA: Insufficient documentation

## 2021-05-07 DIAGNOSIS — O99212 Obesity complicating pregnancy, second trimester: Secondary | ICD-10-CM

## 2021-05-07 DIAGNOSIS — O09522 Supervision of elderly multigravida, second trimester: Secondary | ICD-10-CM

## 2021-05-07 DIAGNOSIS — Z363 Encounter for antenatal screening for malformations: Secondary | ICD-10-CM | POA: Diagnosis present

## 2021-05-07 DIAGNOSIS — O0990 Supervision of high risk pregnancy, unspecified, unspecified trimester: Secondary | ICD-10-CM

## 2021-05-07 DIAGNOSIS — O09299 Supervision of pregnancy with other poor reproductive or obstetric history, unspecified trimester: Secondary | ICD-10-CM

## 2021-05-07 DIAGNOSIS — O09292 Supervision of pregnancy with other poor reproductive or obstetric history, second trimester: Secondary | ICD-10-CM | POA: Diagnosis not present

## 2021-05-07 DIAGNOSIS — O99019 Anemia complicating pregnancy, unspecified trimester: Secondary | ICD-10-CM

## 2021-05-07 DIAGNOSIS — Z148 Genetic carrier of other disease: Secondary | ICD-10-CM

## 2021-05-07 DIAGNOSIS — D563 Thalassemia minor: Secondary | ICD-10-CM

## 2021-05-08 ENCOUNTER — Encounter (HOSPITAL_COMMUNITY)
Admission: RE | Admit: 2021-05-08 | Discharge: 2021-05-08 | Disposition: A | Discharge status: Home / Self Care | Payer: Medicaid Other | Source: Ambulatory Visit | Attending: Family Medicine | Admitting: Family Medicine

## 2021-05-08 DIAGNOSIS — D509 Iron deficiency anemia, unspecified: Secondary | ICD-10-CM

## 2021-05-08 DIAGNOSIS — O99019 Anemia complicating pregnancy, unspecified trimester: Secondary | ICD-10-CM | POA: Diagnosis not present

## 2021-05-08 MED ORDER — SODIUM CHLORIDE 0.9 % IV SOLN
300.0000 mg | INTRAVENOUS | Status: DC
  Administered 2021-05-08: 300 mg via INTRAVENOUS
  Filled 2021-05-08: qty 15

## 2021-05-08 MED ORDER — DIPHENHYDRAMINE HCL 50 MG/ML IJ SOLN: 25.0000 mg | Freq: Once | INTRAMUSCULAR | Status: DC | PRN

## 2021-05-08 MED ORDER — ALBUTEROL SULFATE (2.5 MG/3ML) 0.083% IN NEBU: 2.5000 mg | INHALATION_SOLUTION | Freq: Once | RESPIRATORY_TRACT | Status: DC | PRN

## 2021-05-08 MED ORDER — ACETAMINOPHEN 500 MG PO TABS
ORAL_TABLET | ORAL | Status: AC
  Administered 2021-05-08: 1000 mg via ORAL
  Filled 2021-05-08: qty 2

## 2021-05-08 MED ORDER — LORATADINE 10 MG PO TABS
ORAL_TABLET | ORAL | Status: AC
  Administered 2021-05-08: 10 mg via ORAL
  Filled 2021-05-08: qty 1

## 2021-05-08 MED ORDER — SODIUM CHLORIDE 0.9 % IV SOLN: INTRAVENOUS | Status: DC | PRN

## 2021-05-08 MED ORDER — METHYLPREDNISOLONE SODIUM SUCC 125 MG IJ SOLR: 125.0000 mg | Freq: Once | INTRAMUSCULAR | Status: DC | PRN

## 2021-05-08 MED ORDER — ACETAMINOPHEN 500 MG PO TABS: 1000.0000 mg | ORAL_TABLET | Freq: Once | ORAL | Status: AC

## 2021-05-08 MED ORDER — EPINEPHRINE PF 1 MG/ML IJ SOLN: 0.3000 mg | Freq: Once | INTRAMUSCULAR | Status: DC | PRN

## 2021-05-08 MED ORDER — LORATADINE 10 MG PO TABS
10.0000 mg | ORAL_TABLET | Freq: Once | ORAL | Status: AC
Start: 2021-05-08 — End: 2021-05-08

## 2021-05-08 MED ORDER — SODIUM CHLORIDE 0.9 % IV BOLUS: 500.0000 mL | Freq: Once | INTRAVENOUS | Status: DC | PRN

## 2021-05-13 ENCOUNTER — Encounter: Payer: Medicaid Other | Admitting: Certified Nurse Midwife

## 2021-05-21 ENCOUNTER — Other Ambulatory Visit: Payer: Self-pay | Admitting: Family Medicine

## 2021-05-21 ENCOUNTER — Telehealth: Payer: Self-pay | Admitting: *Deleted

## 2021-05-21 DIAGNOSIS — O219 Vomiting of pregnancy, unspecified: Secondary | ICD-10-CM

## 2021-05-21 DIAGNOSIS — O99612 Diseases of the digestive system complicating pregnancy, second trimester: Secondary | ICD-10-CM

## 2021-05-21 DIAGNOSIS — O0990 Supervision of high risk pregnancy, unspecified, unspecified trimester: Secondary | ICD-10-CM

## 2021-05-21 DIAGNOSIS — K5900 Constipation, unspecified: Secondary | ICD-10-CM

## 2021-05-21 MED ORDER — DOCUSATE SODIUM 100 MG PO CAPS: 100.0000 mg | ORAL_CAPSULE | Freq: Two times a day (BID) | ORAL | 0 refills | Status: DC

## 2021-05-21 MED ORDER — ONDANSETRON 4 MG PO TBDP: 4.0000 mg | ORAL_TABLET | Freq: Four times a day (QID) | ORAL | 0 refills | Status: DC | PRN

## 2021-05-21 NOTE — Telephone Encounter (Signed)
Received a voice message from [name] stating " I don't have any of my medicines and I need them. Can you call them in?" I called Kathryn Livingston and asked her if she never got her meds or what she needs. She states "something happened and I don't have any of my meds and I really need the PNV and the iron. "  I reviewed her chart and we discussed that she has only picked up the initial Rx and no refills for PNV or Iron. I informed her she has refills of both and just needs to call the pharmacy for that. I informed her I can send in refills of zofran and colace if she needs them since there are no refills and it has been over 30 days. She states still has occasional nausea and constipation and would like both. RX sent in per protocol Demontre Padin,RN

## 2021-05-29 ENCOUNTER — Ambulatory Visit (INDEPENDENT_AMBULATORY_CARE_PROVIDER_SITE_OTHER): Payer: Medicaid Other | Admitting: Family

## 2021-05-29 ENCOUNTER — Other Ambulatory Visit: Payer: Self-pay

## 2021-05-29 VITALS — BP 111/69 | HR 97 | Wt 206.0 lb

## 2021-05-29 DIAGNOSIS — Z3480 Encounter for supervision of other normal pregnancy, unspecified trimester: Secondary | ICD-10-CM

## 2021-05-29 DIAGNOSIS — Z3A26 26 weeks gestation of pregnancy: Secondary | ICD-10-CM

## 2021-05-29 DIAGNOSIS — Z3A28 28 weeks gestation of pregnancy: Secondary | ICD-10-CM | POA: Insufficient documentation

## 2021-05-29 DIAGNOSIS — Z8759 Personal history of other complications of pregnancy, childbirth and the puerperium: Secondary | ICD-10-CM

## 2021-05-29 DIAGNOSIS — O0990 Supervision of high risk pregnancy, unspecified, unspecified trimester: Secondary | ICD-10-CM

## 2021-05-29 NOTE — Progress Notes (Signed)
Patient stated that she is feeling fine. 

## 2021-05-29 NOTE — Progress Notes (Signed)
   PRENATAL VISIT NOTE  Subjective:  Kathryn Livingston is a 35 y.o. [id number] at [redacted]w[redacted]d being seen today for ongoing prenatal care.  She is currently monitored for the following issues for this high-risk pregnancy and has Continuous tobacco abuse; Marijuana use; History of polyhydramnios; Severe major depression, single episode, with psychotic features (HCC); Supervision of high risk pregnancy, antepartum; Advanced maternal age in multigravida; Anemia during pregnancy; Alpha thalassemia silent carrier; and [redacted] weeks gestation of pregnancy on their problem list.  Patient reports no complaints.  Contractions: Not present. Vag. Bleeding: None.  Movement: Present. Denies leaking of fluid.   The following portions of the patient's history were reviewed and updated as appropriate: allergies, current medications, past family history, past medical history, past social history, past surgical history and problem list.   Objective:   Vitals:   05/29/21 1053  BP: 111/69  Pulse: 97  Weight: 206 lb (93.4 kg)    Fetal Status: Fetal Heart Rate (bpm): 156   Movement: Present     General:  Alert, oriented and cooperative. Patient is in no acute distress.  Skin: Skin is warm and dry. No rash noted.   Cardiovascular: Normal heart rate noted  Respiratory: Normal respiratory effort, no problems with respiration noted  Abdomen: Soft, gravid, appropriate for gestational age.  Pain/Pressure: Absent     Pelvic: Cervical exam deferred        Extremities: Normal range of motion.  Edema: Trace  Mental Status: Normal mood and affect. Normal behavior. Normal judgment and thought content.   Assessment and Plan:  Pregnancy: [id number] at [redacted]w[redacted]d 1. Supervision of high risk pregnancy, antepartum - Glucose Tolerance, 2 Hours w/1 Hour; Future - HIV Antibody (routine testing w rflx); Future - Tdap vaccine greater than or equal to 7yo IM; Future - RPR; Future  2. History of polyhydramnios - Normal AFI this pregnancy   3.  History of Trichomoniasis - Treated in early pregnancy - Desires re-screen at next appt  Preterm labor symptoms and general obstetric precautions including but not limited to vaginal bleeding, contractions, leaking of fluid and fetal movement were reviewed in detail with the patient. Please refer to After Visit Summary for other counseling recommendations.   Return in about 2 weeks (around 06/12/2021) for 28 week lab and visit.  Future Appointments  Date Time Provider Department Center  06/11/2021 11:15 AM [facility] NURSE [facility]  06/11/2021 11:30 AM [facility] US3 [facility]    ELFYBOF Eloisa Northern, CNM

## 2021-06-11 ENCOUNTER — Other Ambulatory Visit: Payer: Self-pay

## 2021-06-11 ENCOUNTER — Ambulatory Visit: Payer: Medicaid Other

## 2021-06-11 DIAGNOSIS — O0990 Supervision of high risk pregnancy, unspecified, unspecified trimester: Secondary | ICD-10-CM

## 2021-06-12 ENCOUNTER — Other Ambulatory Visit (HOSPITAL_COMMUNITY)
Admission: RE | Admit: 2021-06-12 | Discharge: 2021-06-12 | Disposition: A | Discharge status: Home / Self Care | Payer: Medicaid Other | Source: Ambulatory Visit | Attending: Family | Admitting: Family

## 2021-06-12 ENCOUNTER — Ambulatory Visit (INDEPENDENT_AMBULATORY_CARE_PROVIDER_SITE_OTHER): Payer: Medicaid Other | Admitting: Family

## 2021-06-12 ENCOUNTER — Encounter: Payer: Self-pay | Admitting: Family

## 2021-06-12 ENCOUNTER — Other Ambulatory Visit: Payer: Medicaid Other

## 2021-06-12 ENCOUNTER — Other Ambulatory Visit: Payer: Self-pay

## 2021-06-12 VITALS — BP 108/66 | HR 88 | Wt 201.9 lb

## 2021-06-12 DIAGNOSIS — A5901 Trichomonal vulvovaginitis: Secondary | ICD-10-CM | POA: Insufficient documentation

## 2021-06-12 DIAGNOSIS — O99019 Anemia complicating pregnancy, unspecified trimester: Secondary | ICD-10-CM

## 2021-06-12 DIAGNOSIS — O0990 Supervision of high risk pregnancy, unspecified, unspecified trimester: Secondary | ICD-10-CM | POA: Diagnosis not present

## 2021-06-12 DIAGNOSIS — Z23 Encounter for immunization: Secondary | ICD-10-CM | POA: Diagnosis not present

## 2021-06-12 DIAGNOSIS — Z3A28 28 weeks gestation of pregnancy: Secondary | ICD-10-CM

## 2021-06-12 DIAGNOSIS — O23599 Infection of other part of genital tract in pregnancy, unspecified trimester: Secondary | ICD-10-CM | POA: Insufficient documentation

## 2021-06-12 DIAGNOSIS — Z5941 Food insecurity: Secondary | ICD-10-CM

## 2021-06-12 DIAGNOSIS — O23593 Infection of other part of genital tract in pregnancy, third trimester: Secondary | ICD-10-CM

## 2021-06-12 DIAGNOSIS — Z8659 Personal history of other mental and behavioral disorders: Secondary | ICD-10-CM

## 2021-06-12 NOTE — Patient Instructions (Signed)
[employer]) [email or web address] Address: [address] [place], [place] [address] Phone: [phone]

## 2021-06-12 NOTE — Progress Notes (Signed)
   PRENATAL VISIT NOTE  Subjective:  Kathryn Livingston is a 35 y.o. [id number] at [redacted]w[redacted]d being seen today for ongoing prenatal care.  She is currently monitored for the following issues for this high-risk pregnancy and has Continuous tobacco abuse; Marijuana use; History of polyhydramnios; Severe major depression, single episode, with psychotic features (HCC); Supervision of high risk pregnancy, antepartum; Advanced maternal age in multigravida; Anemia during pregnancy; Alpha thalassemia silent carrier; [redacted] weeks gestation of pregnancy; and Trichomonal vaginitis in pregnancy on their problem list.  Patient reports intermittent, occasional pressure; no report of contractions or bleeding.  Contractions: Not present. Vag. Bleeding: None.  Movement: Present. Denies leaking of fluid.   The following portions of the patient's history were reviewed and updated as appropriate: allergies, current medications, past family history, past medical history, past social history, past surgical history and problem list.   Objective:   Vitals:   06/12/21 0832  BP: 108/66  Pulse: 88  Weight: 201 lb 14.4 oz (91.6 kg)    Fetal Status: Fetal Heart Rate (bpm): 156   Movement: Present     General:  Alert, oriented and cooperative. Patient is in no acute distress.  Skin: Skin is warm and dry. No rash noted.   Cardiovascular: Normal heart rate noted  Respiratory: Normal respiratory effort, no problems with respiration noted  Abdomen: Soft, gravid, appropriate for gestational age.  Pain/Pressure: Present     Pelvic: Cervical exam deferred        Extremities: Normal range of motion.  Edema: Trace  Mental Status: Normal mood and affect. Normal behavior. Normal judgment and thought content.   Assessment and Plan:  Pregnancy: [id number] at [redacted]w[redacted]d 1. Supervision of high risk pregnancy, antepartum - Tdap vaccine greater than or equal to 7yo IM - 28 week labs obtained  2. Food insecurity - Taken to food market  3.  Trichomonal vaginitis during pregnancy in third trimester - Rescreen today  4. [redacted] weeks gestation of pregnancy - Continue prenatal care  5. Anemia during pregnancy - Continue ferrous sulfate  6.  History of Depression - Referral for Behavioral Health in office  Preterm labor symptoms and general obstetric precautions including but not limited to vaginal bleeding, contractions, leaking of fluid and fetal movement were reviewed in detail with the patient. Please refer to After Visit Summary for other counseling recommendations.   Return in about 2 weeks (around 06/26/2021).  Future Appointments  Date Time Provider Department Center  06/24/2021  3:30 PM [facility] NURSE [facility]  06/24/2021  3:45 PM [facility] US1 [facility]    OXBDZHG Eloisa Northern, CNM

## 2021-06-13 LAB — CBC
Hematocrit: 27.2 % — ABNORMAL LOW (ref 34.0–46.6)
Hemoglobin: 9 g/dL — ABNORMAL LOW (ref 11.1–15.9)
MCH: 30.2 pg (ref 26.6–33.0)
MCHC: 33.1 g/dL (ref 31.5–35.7)
MCV: 91 fL (ref 79–97)
Platelets: 287 10*3/uL (ref 150–450)
RBC: 2.98 x10E6/uL — ABNORMAL LOW (ref 3.77–5.28)
RDW: 12.1 % (ref 11.7–15.4)
WBC: 7.2 10*3/uL (ref 3.4–10.8)

## 2021-06-13 LAB — RPR: RPR Ser Ql: NONREACTIVE

## 2021-06-13 LAB — GLUCOSE TOLERANCE, 2 HOURS W/ 1HR
Glucose, 1 hour: 113 mg/dL (ref 65–179)
Glucose, 2 hour: 92 mg/dL (ref 65–152)
Glucose, Fasting: 74 mg/dL (ref 65–91)

## 2021-06-13 LAB — HIV ANTIBODY (ROUTINE TESTING W REFLEX): HIV Screen 4th Generation wRfx: NONREACTIVE

## 2021-06-15 LAB — CERVICOVAGINAL ANCILLARY ONLY
Bacterial Vaginitis (gardnerella): POSITIVE — AB
Candida Glabrata: NEGATIVE
Candida Vaginitis: POSITIVE — AB
Chlamydia: NEGATIVE
Comment: NEGATIVE
Comment: NEGATIVE
Comment: NEGATIVE
Comment: NEGATIVE
Comment: NEGATIVE
Comment: NORMAL
Neisseria Gonorrhea: NEGATIVE
Trichomonas: POSITIVE — AB

## 2021-06-17 ENCOUNTER — Other Ambulatory Visit: Payer: Self-pay

## 2021-06-17 DIAGNOSIS — B379 Candidiasis, unspecified: Secondary | ICD-10-CM

## 2021-06-17 DIAGNOSIS — N760 Acute vaginitis: Secondary | ICD-10-CM

## 2021-06-17 DIAGNOSIS — B9689 Other specified bacterial agents as the cause of diseases classified elsewhere: Secondary | ICD-10-CM

## 2021-06-17 DIAGNOSIS — A5901 Trichomonal vulvovaginitis: Secondary | ICD-10-CM

## 2021-06-17 MED ORDER — TERCONAZOLE 0.4 % VA CREA: 1.0000 | TOPICAL_CREAM | Freq: Every day | VAGINAL | 0 refills | Status: DC

## 2021-06-17 MED ORDER — METRONIDAZOLE 500 MG PO TABS: 500.0000 mg | ORAL_TABLET | Freq: Two times a day (BID) | ORAL | 0 refills | Status: DC

## 2021-06-17 NOTE — Progress Notes (Signed)
Trichomonas, BV, and yeast treated per protocol.

## 2021-06-24 ENCOUNTER — Ambulatory Visit: Payer: Medicaid Other

## 2021-06-26 NOTE — Progress Notes (Signed)
PRENATAL VISIT NOTE  Subjective:  Kathryn Livingston is a 35 y.o. [id number] at [redacted]w[redacted]d being seen today for ongoing prenatal care.  She is currently monitored for the following issues for this low-risk pregnancy and has Continuous tobacco abuse; Marijuana use; History of polyhydramnios; Severe major depression, single episode, with psychotic features (HCC); Supervision of high risk pregnancy, antepartum; Advanced maternal age in multigravida; Anemia during pregnancy; Alpha thalassemia silent carrier; and Trichomonal vaginitis in pregnancy on their problem list.  Patient reports no complaints.  Contractions: Not present. Vag. Bleeding: None.  Movement: Present. Denies leaking of fluid.   The following portions of the patient's history were reviewed and updated as appropriate: allergies, current medications, past family history, past medical history, past social history, past surgical history and problem list.   Objective:   Vitals:   06/29/21 1405  BP: 123/75  Pulse: 91  Weight: 201 lb (91.2 kg)    Fetal Status: Fetal Heart Rate (bpm): 158   Movement: Present     General:  Alert, oriented and cooperative. Patient is in no acute distress.  Skin: Skin is warm and dry. No rash noted.   Cardiovascular: Normal heart rate noted  Respiratory: Normal respiratory effort, no problems with respiration noted  Abdomen: Soft, gravid, appropriate for gestational age.  Pain/Pressure: Absent     Pelvic: Cervical exam deferred        Extremities: Normal range of motion.  Edema: None  Mental Status: Normal mood and affect. Normal behavior. Normal judgment and thought content.   Assessment and Plan:  Pregnancy: [id number] at [redacted]w[redacted]d 1. Alpha thalassemia silent carrier - no issues  2. Anemia during pregnancy - HgB 9.0 which is largely unchanged from baseline - Reviewed PO iron intake - Discussed IV iron - did one - has done two sessions. No improvement. Will check ferritin and B12/folate.   3. Multigravida  of advanced maternal age in third trimester - NIPS wnl, anatomy scan wnl - Desires permanent sterilization. Tubal papers signed.  - Discussed pros/cons of PPTL vs interval salpingectomy. Discussed we always try to do salpingectomy PP but sometimes not able but advantage is done before discharge. Will give information and she will consider which way she wants to try.   4. Supervision of high risk pregnancy, antepartum - 28w labs wnl besides anemia - Declines flu shot  5. Severe major depression, single episode, with psychotic features (HCC) - Referred to behavioral health last time - not yet contacted by them  6. History of polyhydramnios - Normal afi. Has next [place] on 10/5  7. Marijuana use - Smoking much less than before - she is trying her best - Counseled on cessation  8. Continuous tobacco abuse - 1-2 cpd - would like to try the gum. Rx sent to pharmacy.   9. Trichomonal vagitis in pregnancy - Positive and treated on 9/21 - TOC next time  10. Desires permanent sterilization - She desires permanent sterilization. Discussed alternatives including LARC options and vasectomy. She declines these options.  - Discussed surgery of salpingectomy vs tubal ligation. She would like to do a salpingectomy.  - Discussed risks: bleeding, infection, injury to surrounding organs/tissues, possible need for open surgery - Reviewed restrictions and recovery following surgery   Term labor symptoms and general obstetric precautions including but not limited to vaginal bleeding, contractions, leaking of fluid and fetal movement were reviewed in detail with the patient. Please refer to After Visit Summary for other counseling recommendations.   Return in about 2  weeks (around 07/13/2021).  Future Appointments  Date Time Provider Department Center  07/01/2021  1:30 PM [facility] NURSE [facility]  07/01/2021  1:45 PM [facility] US4 [facility]    Milas Hock, MD

## 2021-06-29 ENCOUNTER — Encounter: Payer: Self-pay | Admitting: Obstetrics and Gynecology

## 2021-06-29 ENCOUNTER — Other Ambulatory Visit: Payer: Self-pay

## 2021-06-29 ENCOUNTER — Ambulatory Visit (INDEPENDENT_AMBULATORY_CARE_PROVIDER_SITE_OTHER): Payer: Medicaid Other | Admitting: Obstetrics and Gynecology

## 2021-06-29 VITALS — BP 123/75 | HR 91 | Wt 201.0 lb

## 2021-06-29 DIAGNOSIS — F1290 Cannabis use, unspecified, uncomplicated: Secondary | ICD-10-CM

## 2021-06-29 DIAGNOSIS — O09523 Supervision of elderly multigravida, third trimester: Secondary | ICD-10-CM

## 2021-06-29 DIAGNOSIS — Z8759 Personal history of other complications of pregnancy, childbirth and the puerperium: Secondary | ICD-10-CM

## 2021-06-29 DIAGNOSIS — D563 Thalassemia minor: Secondary | ICD-10-CM

## 2021-06-29 DIAGNOSIS — O99019 Anemia complicating pregnancy, unspecified trimester: Secondary | ICD-10-CM

## 2021-06-29 DIAGNOSIS — F323 Major depressive disorder, single episode, severe with psychotic features: Secondary | ICD-10-CM

## 2021-06-29 DIAGNOSIS — O0990 Supervision of high risk pregnancy, unspecified, unspecified trimester: Secondary | ICD-10-CM

## 2021-06-29 DIAGNOSIS — Z720 Tobacco use: Secondary | ICD-10-CM

## 2021-06-29 MED ORDER — NICOTINE POLACRILEX 2 MG MT GUM: 2.0000 mg | CHEWING_GUM | OROMUCOSAL | 1 refills | Status: DC | PRN

## 2021-06-29 NOTE — BH Specialist Note (Signed)
Integrated Behavioral Health via Telemedicine Visit  06/29/2021 [name] [id number]  Number of Integrated Behavioral Health visits: 1 Session Start time: 9:20  Session End time: 9:53 Total time:  33  Referring Provider: Rochele Pages, CNM Patient/Family location: Home [place] Provider location: Center for [facility]  All persons participating in visit: [name] and [facility] Ardine Iacovelli   Types of Service: Individual psychotherapy and Telephone visit  I connected with Minda Ditto Holmes and/or Minda Ditto Cubillos's  n/a  via  Telephone or Video Enabled Telemedicine Application  (Video is Caregility application) and verified that I am speaking with the correct person using two identifiers. Discussed confidentiality: Yes   I discussed the limitations of telemedicine and the availability of in person appointments.  Discussed there is a possibility of technology failure and discussed alternative modes of communication if that failure occurs.  I discussed that engaging in this telemedicine visit, they consent to the provision of behavioral healthcare and the services will be billed under their insurance.  Patient and/or legal guardian expressed understanding and consented to Telemedicine visit: Yes   Presenting Concerns: Patient and/or family reports the following symptoms/concerns: Fatigue, poor appetite, sleep difficulty, attributed to last trimester pregnancy; pt says she has not been on BH medication for "many years" and prefers self-coping strategies to manage emotions.  Duration of problem: Current pregnancy; Severity of problem: mild  Patient and/or Family's Strengths/Protective Factors: Social connections, Concrete supports in place (healthy food, safe environments, etc.), and Sense of purpose  Goals Addressed: Patient will:  Reduce symptoms of: stress   Increase knowledge and/or ability of: healthy habits and self-management  skills   Demonstrate ability to: Increase healthy adjustment to current life circumstances  Progress towards Goals: Ongoing  Interventions: Interventions utilized:  Mindfulness or Management consultant, Sleep Hygiene, Psychoeducation and/or Health Education, and Link to [employer] Standardized Assessments completed:  PHQ9/GAD7 in past two weeks  Patient and/or Family Response: Pt agrees with treatment plan  Assessment: Patient currently experiencing Other specified counseling.   Patient may benefit from psychoeducation and brief therapeutic interventions regarding coping with symptoms of current life stress .  Plan: Follow up with behavioral health clinician on : Two weeks Behavioral recommendations:  -CALM relaxation breathing exercise before bedtime, and as needed throughout the day -Continue taking prenatal vitamin daily -Consider additional resources on After Visit Summary -Continue self-advocacy for appropriate and respectful healthcare Referral(s): Integrated Art gallery manager (In Clinic) and [employer]:  new mom support  I discussed the assessment and treatment plan with the patient and/or parent/guardian. They were provided an opportunity to ask questions and all were answered. They agreed with the plan and demonstrated an understanding of the instructions.   They were advised to call back or seek an in-person evaluation if the symptoms worsen or if the condition fails to improve as anticipated.  Rae Lips, LCSW  Depression screen [facility] 2/9 06/29/2021 06/12/2021 05/29/2021 04/16/2021 03/19/2021  Decreased Interest 0 0 0 0 0  Down, Depressed, Hopeless 0 0 1 0 0  PHQ - 2 Score 0 0 1 0 0  Altered sleeping 0 0 0 3 0  Tired, decreased energy 1 0 0 0 0  Change in appetite 1 0 0 0 0  Feeling bad or failure about yourself  0 0 0 0 0  Trouble concentrating 0 0 0 0 0  Moving slowly or fidgety/restless 0 0 0 0 0  Suicidal thoughts 0 0 0  0 0  PHQ-9  Score 2 0 1 3 0   GAD 7 : Generalized Anxiety Score 06/29/2021 06/12/2021 05/29/2021 04/16/2021  Nervous, Anxious, on Edge 0 0 0 0  Control/stop worrying 0 0 0 0  Worry too much - different things 0 1 0 0  Trouble relaxing 1 0 0 0  Restless 0 0 0 0  Easily annoyed or irritable 0 0 0 0  Afraid - awful might happen 0 0 0 0  Total GAD 7 Score 1 1 0 0

## 2021-06-30 LAB — B12 AND FOLATE PANEL
Folate: 18.4 ng/mL (ref >3.0)
Vitamin B-12: 341 pg/mL (ref 232–1245)

## 2021-06-30 LAB — FERRITIN: Ferritin: 218 ng/mL — ABNORMAL HIGH (ref 15–150)

## 2021-07-01 ENCOUNTER — Ambulatory Visit: Payer: Medicaid Other

## 2021-07-01 ENCOUNTER — Encounter: Payer: Self-pay | Admitting: *Deleted

## 2021-07-01 ENCOUNTER — Ambulatory Visit: Payer: Medicaid Other | Attending: Obstetrics | Admitting: *Deleted

## 2021-07-01 ENCOUNTER — Other Ambulatory Visit: Payer: Self-pay

## 2021-07-01 VITALS — BP 105/52 | HR 87

## 2021-07-01 DIAGNOSIS — Z148 Genetic carrier of other disease: Secondary | ICD-10-CM | POA: Diagnosis not present

## 2021-07-01 DIAGNOSIS — E669 Obesity, unspecified: Secondary | ICD-10-CM | POA: Diagnosis not present

## 2021-07-01 DIAGNOSIS — Z3A31 31 weeks gestation of pregnancy: Secondary | ICD-10-CM | POA: Diagnosis not present

## 2021-07-01 DIAGNOSIS — O99019 Anemia complicating pregnancy, unspecified trimester: Secondary | ICD-10-CM

## 2021-07-01 DIAGNOSIS — Z363 Encounter for antenatal screening for malformations: Secondary | ICD-10-CM | POA: Diagnosis not present

## 2021-07-01 DIAGNOSIS — A5901 Trichomonal vulvovaginitis: Secondary | ICD-10-CM

## 2021-07-01 DIAGNOSIS — O09293 Supervision of pregnancy with other poor reproductive or obstetric history, third trimester: Secondary | ICD-10-CM | POA: Insufficient documentation

## 2021-07-01 DIAGNOSIS — O09523 Supervision of elderly multigravida, third trimester: Secondary | ICD-10-CM | POA: Diagnosis not present

## 2021-07-01 DIAGNOSIS — O0990 Supervision of high risk pregnancy, unspecified, unspecified trimester: Secondary | ICD-10-CM

## 2021-07-01 DIAGNOSIS — D563 Thalassemia minor: Secondary | ICD-10-CM

## 2021-07-01 DIAGNOSIS — O23593 Infection of other part of genital tract in pregnancy, third trimester: Secondary | ICD-10-CM

## 2021-07-01 DIAGNOSIS — O99213 Obesity complicating pregnancy, third trimester: Secondary | ICD-10-CM | POA: Insufficient documentation

## 2021-07-02 ENCOUNTER — Ambulatory Visit (INDEPENDENT_AMBULATORY_CARE_PROVIDER_SITE_OTHER): Payer: Medicaid Other | Admitting: Clinical

## 2021-07-02 ENCOUNTER — Telehealth: Payer: Self-pay

## 2021-07-02 DIAGNOSIS — Z7189 Other specified counseling: Secondary | ICD-10-CM | POA: Diagnosis not present

## 2021-07-02 DIAGNOSIS — O99019 Anemia complicating pregnancy, unspecified trimester: Secondary | ICD-10-CM

## 2021-07-02 NOTE — Patient Instructions (Signed)
Center for [facility] at [facility] Kathryn Livingston, Kathryn Livingston [id number] [phone] (main office) [phone] ([place] office)  New mom support groups:   Www.postpartum.net   Www.conehealthybaby.com (also video of new [facility])     BRAINSTORMING  Develop a Plan Goals: Provide a way to start conversation about your new life with a baby Assist parents in recognizing and using resources within their reach Help pave the way before birth for an easier period of transition afterwards.  Make a list of the following information to keep in a central location: Full name of Mom and Partner: _____________________________________________ 83 full name and Date of Birth: ___________________________________________ Home Address: ___________________________________________________________ ________________________________________________________________________ Home Phone: ____________________________________________________________ Parents' cell numbers: _____________________________________________________ ________________________________________________________________________ Name and contact info for OB: ______________________________________________ Name and contact info for Pediatrician:________________________________________ Contact info for Lactation Consultants: ________________________________________  REST and SLEEP *You each need at least 4-5 hours of uninterrupted sleep every day. Write specific names and contact information.* How are you going to rest in the postpartum period? While partner's home? When partner returns to work? When you both return to work? Where will your baby sleep? Who is available to help during the day? Evening? Night? Who could move in for a period to help support you? What are some ideas to help you get enough  sleep? __________________________________________________________________________________________________________________________________________________________________________________________________________________________________________ NUTRITIOUS FOOD AND DRINK *Plan for meals before your baby is born so you can have healthy food to eat during the immediate postpartum period.* Who will look after breakfast? Lunch? Dinner? List names and contact information. Brainstorm quick, healthy ideas for each meal. What can you do before baby is born to prepare meals for the postpartum period? How can others help you with meals? Which grocery stores provide online shopping and delivery? Which restaurants offer take-out or delivery options? ______________________________________________________________________________________________________________________________________________________________________________________________________________________________________________________________________________________________________________________________________________________________________________________________________  CARE FOR MOM *It's important that mom is cared for and pampered in the postpartum period. Remember, the most important ways new mothers need care are: sleep, nutrition, gentle exercise, and time off.* Who can come take care of mom during this period? Make a list of people with their contact information. List some activities that make you feel cared for, rested, and energized? Who can make sure you have opportunities to do these things? Does mom have a space of her very own within your home that's just for her? Make a "[facility]" where she can be comfortable, rest, and renew herself  daily. ______________________________________________________________________________________________________________________________________________________________________________________________________________________________________________________________________________________________________________________________________________________________________________________________________    CARE FOR AND FEEDING BABY *Knowledgeable and encouraging people will offer the best support with regard to feeding your baby.* Educate yourself and choose the best feeding option for your baby. Make a list of people who will guide, support, and be a resource for you as your care for and feed your baby. (Friends that have breastfed or are currently breastfeeding, lactation consultants, breastfeeding support groups, etc.) Consider a postpartum doula. (These websites can give you information: dona.org & BuyingShow.es) Seek out local breastfeeding resources like the breastfeeding support group at [employer] or [employer]. ______________________________________________________________________________________________________________________________________________________________________________________________________________________________________________________________________________________________________________________________________________________________________________________________________  Verner Chol AND ERRANDS Who can help with a thorough cleaning before baby is born? Make a list of people who will help with housekeeping and chores, like laundry, light cleaning, dishes, bathrooms, etc. Who can run some errands for you? What can you do to make sure you are stocked with basic supplies before baby is born? Who is going to do the  shopping? ______________________________________________________________________________________________________________________________________________________________________________________________________________________________________________________________________________________________________________________________________________________________________________________________________     Family Adjustment *Nurture yourselves.it helps parents be more loving  and allows for better bonding with their child.* What sorts of things do you and partner enjoy doing together? Which activities help you to connect and strengthen your relationship? Make a list of those things. Make a list of people whom you trust to care for your baby so you can have some time together as a couple. What types of things help partner feel connected to Mom? Make a list. What needs will partner have in order to bond with baby? Other children? Who will care for them when you go into labor and while you are in the hospital? Think about what the needs of your older children might be. Who can help you meet those needs? In what ways are you helping them prepare for bringing baby home? List some specific strategies you have for family adjustment. _______________________________________________________________________________________________________________________________________________________________________________________________________________________________________________________________________________________________________________________________________________  SUPPORT *Someone who can empathize with experiences normalizes your problems and makes them more bearable.* Make a list of other friends, neighbors, and/or co-workers you know with infants (and small children, if applicable) with whom you can connect. Make a list of local or online support groups, mom groups, etc. in which you can be  involved. ______________________________________________________________________________________________________________________________________________________________________________________________________________________________________________________________________________________________________________________________________________________________________________________________________  Childcare Plans Investigate and plan for childcare if mom is returning to work. Talk about mom's concerns about her transition back to work. Talk about partner's concerns regarding this transition.  Mental Health *Your mental health is one of the highest priorities for a pregnant or postpartum mom.* 1 in 5 women experience anxiety and/or depression from the time of conception through the first year after birth. Postpartum Mood Disorders are the #1 complication of pregnancy and childbirth and the suffering experienced by these mothers is not necessary! These illnesses are temporary and respond well to treatment, which often includes self-care, social support, talk therapy, and medication when needed. Women experiencing anxiety and depression often say things like: "I'm supposed to be happy.why do I feel so sad?", "Why can't I snap out of it?", "I'm having thoughts that scare me." There is no need to be embarrassed if you are feeling these symptoms: Overwhelmed, anxious, angry, sad, guilty, irritable, hopeless, exhausted but can't sleep You are NOT alone. You are NOT to blame. With help, you WILL be well. Where can I find help? Medical professionals such as your OB, midwife, gynecologist, family practitioner, primary care provider, pediatrician, or mental health providers; [facility] support groups: Feelings After Birth, Breastfeeding Support Group, Baby and Me Group, and Fit 4 Two exercise classes. You have permission to ask for help. It will confirm your feelings, validate your experiences,  share/learn coping strategies, and gain support and encouragement as you heal. You are important! BRAINSTORM Make a list of local resources, including resources for mom and for partner. Identify support groups. Identify people to call late at night - include names and contact info. Talk with partner about perinatal mood and anxiety disorders. Talk with your OB, midwife, and doula about baby blues and about perinatal mood and anxiety disorders. Talk with your pediatrician about perinatal mood and anxiety disorders.   Support & Sanity Savers   What do you really need?  Basics In preparing for a new baby, many expectant parents spend hours shopping for baby clothes, decorating the nursery, and deciding which car seat to buy. Yet most don't think much about what the reality of parenting a newborn will be like, and what they need to make it through that. So, here is the advice of experienced parents. We know you'll read this, and think "  they're exaggerating, I don't really need that." Just trust [place] on these, OK? Plan for all of this, and if it turns out you don't need it, come back and teach [place] how you did it!  Must-Haves (Once baby's survival needs are met, make sure you attend to your own survival needs!) Sleep An average newborn sleeps 16-18 hours per day, over 6-7 sleep periods, rarely more than three hours at a time. It is normal and healthy for a newborn to wake throughout the night... but really hard on parents!! Naps. Prioritize sleep above any responsibilities like: cleaning house, visiting friends, running errands, etc.  Sleep whenever baby sleeps. If you can't nap, at least have restful times when baby eats. The more rest you get, the more patient you will be, the more emotionally stable, and better at solving problems.  Food You may not have realized it would be difficult to eat when you have a newborn. Yet, when we talk to countless new parents, they say things like "it may be 2:00 pm  when I realize I haven't had breakfast yet." Or "every time we sit down to dinner, baby needs to eat, and my food gets cold, so I don't bother to eat it." Finger food. Before your baby is born, stock up with one months' worth of food that: 1) you can eat with one hand while holding a baby, 2) doesn't need to be prepped, 3) is good hot or cold, 4) doesn't spoil when left out for a few hours, and 5) you like to eat. Think about: nuts, dried fruit, Clif bars, pretzels, jerky, gogurt, baby carrots, apples, bananas, crackers, cheez-n-crackers, string cheese, hot pockets or frozen burritos to microwave, garden burgers and breakfast pastries to put in the toaster, yogurt drinks, etc. Restaurant Menus. Make lists of your favorite restaurants & menu items. When family/friends want to help, you can give specific information without much thought. They can either bring you the food or send gift cards for just the right meals. Freezer Meals.  Take some time to make a few meals to put in the freezer ahead of time.  Easy to freeze meals can be anything such as soup, lasagna, chicken pie, or spaghetti sauce. Set up a Meal Schedule.  Ask friends and family to sign up to bring you meals during the first few weeks of being home. (It can be passed around at baby showers!) You have no idea how helpful this will be until you are in the throes of parenting.  https://hamilton-woodard.com/ is a great website to check out. Emotional Support Know who to call when you're stressed out. Parenting a newborn is very challenging work. There are times when it totally overwhelms your normal coping abilities. EVERY NEW PARENT NEEDS TO HAVE A PLAN FOR WHO TO CALL WHEN THEY JUST CAN'T COPE ANY MORE. (And it has to be someone other than the baby's other parent!) Before your baby is born, come up with at least one person you can call for support - write their phone number down and post it on the refrigerator. Anxiety & Sadness. Baby blues are normal after  pregnancy; however, there are more severe types of anxiety & sadness which can occur and should not be ignored.  They are always treatable, but you have to take the first step by reaching out for help. [facility] offers a "Mom Talk" group which meets every Tuesday from 10 am - 11 am.  This group is for new moms who need support  and connection after their babies are born.  Call [phone].  Really, Really Helpful (Plan for them! Make sure these happen often!!) Physical Support with Taking Care of Yourselves Asking friends and family. Before your baby is born, set up a schedule of people who can come and visit and help out (or ask a friend to schedule for you). Any time someone says "let me know what I can do to help," sign them up for a day. When they get there, their job is not to take care of the baby (that's your job and your joy). Their job is to take care of you!  Postpartum doulas. If you don't have anyone you can call on for support, look into postpartum doulas:  professionals at helping parents with caring for baby, caring for themselves, getting breastfeeding started, and helping with household tasks. www.padanc.org is a helpful website for learning about doulas in our area. Peer Support / Parent Groups Why: One of the greatest ideas for new parents is to be around other new parents. Parent groups give you a chance to share and listen to others who are going through the same season of life, get a sense of what is normal infant development by watching several babies learn and grow, share your stories of triumph and struggles with empathetic ears, and forgive your own mistakes when you realize all parents are learning by trial and error. Where to find: There are many places you can meet other new parents throughout our community.  [facility] offers the following classes for new moms and their little ones:  Baby and Me (Birth to [place]) and Breastfeeding Support Group. Go to  www.conehealthybaby.com or call [phone] for more information. Time for your Relationship It's easy to get so caught up in meeting baby's immediate needs that it's hard to find time to connect with your partner, and meet the needs of your relationship. It's also easy to forget what "quality time with your partner" actually looks like. If you take your baby on a date, you'd be amazed how much of your couple time is spent feeding the baby, diapering the baby, admiring the baby, and talking about the baby. Dating: Try to take time for just the two of you. Babysitter tip: Sometimes when moms are breastfeeding a newborn, they find it hard to figure out how to schedule outings around baby's unpredictable feeding schedules. Have the babysitter come for a three hour period. When she comes over, if baby has just eaten, you can leave right away, and come back in two hours. If baby hasn't fed recently, you start the date at home. Once baby gets hungry and gets a good feeding in, you can head out for the rest of your date time. Date Nights at Home: If you can't get out, at least set aside one evening a week to prioritize your relationship: whenever baby dozes off or doesn't have any immediate needs, spend a little time focusing on each other. Potential conflicts: The main relationship conflicts that come up for new parents are: issues related to sexuality, financial stresses, a feeling of an unfair division of household tasks, and conflicts in parenting styles. The more you can work on these issues before baby arrives, the better!  Fun and Frills (Don't forget these. and don't feel guilty for indulging in them!) Everyone has something in life that is a fun little treat that they do just for themselves. It may be: reading the morning paper, or going for a daily jog,  or having coffee with a friend once a week, or going to a movie on Friday nights, or fine chocolates, or bubble baths, or curling up with a good  book. Unless you do fun things for yourself every now and then, it's hard to have the energy for fun with your baby. Whatever your "special" treats are, make sure you find a way to continue to indulge in them after your baby is born. These special moments can recharge you, and allow you to return to baby with a new joy   PERINATAL MOOD DISORDERS: [place]   Emergency and Crisis Resources:  If you are an imminent risk to self or others, are experiencing intense personal distress, and/or have noticed significant changes in activities of daily living, call:  [facility]: [place]: [place]: [phone] Or visit the following crisis centers: Local Emergency Departments [facility]: [address], [place]. Hours: 8:30AM-5PM. Insurance Accepted: Medicaid, Medicare, and Uninsured.  [facility]  [address] [place] Mon-Friday 8am-3pm  [phone]                                                                                    Non-Crisis Resources: To identify specific providers that are covered by your insurance, contact your insurance company or local agencies: [place]: [phone] [facility] and [place]: [phone] [place]: [phone] Postpartum Support International- [place] [phone]                                                      Outpatient therapy and medication management providers:  [facility] [phone] Hours: 9AM-5PM  Insurance Accepted: Alben Spittle, Lorella Nimrod, Freddrick March, [place], Medicare  [facility] ([place]) [phone] Hours: 8AM-5PM  nsurance Accepted: All insurances EXCEPT AARP, [place], [place], and [place]: [phone]             Hours: 8AM-8PM Insurance  Accepted: Cristal Ford, Freddrick March, [place], Medicare, [phone] [facility]: [phone] Hours: 8:30AM-7PM Insurance Accepted: Cristal Ford, Medicaid, Medicare, Tricare, [employer] Counseling:  [place] Accepted:  Holland Falling, Lorella Nimrod, [employer], [place], [facility] [phone] Hours: 9AM-5:30PM Insurance Accepted: Alben Spittle, Charlotte Crumb, and Medicaid, Medicare, [employer] [facility] Counsel[facility]:  [phone] Hours: 9am-5pm Insurance Accepted: BCBS; they do not accept Medicaid/Medicare [place]: [phone] Hours: 9am-9pm Insurance Accepted: All major insurance including Medicaid and Medicare [facility]: [phone] Hours: 9AM-5:30PM Insurance Accepted: All insurances EXCEPT Medicaid and Medicare. [facility]: [phone]  Parenting [place]: [phone] [facility]:  [place] (support for children in the NICU and/or with special needs), [place] [facility]: [phone]                                                                                     Online Resources: Postpartum Support International: http://jones-berg.com/  800-944-4PPD 2Moms Supporting Moms:  www.momssupportingmoms.net

## 2021-07-02 NOTE — Telephone Encounter (Addendum)
-----   Message from Milas Hock, MD sent at 07/02/2021  8:55 AM EDT ----- Please refer to hematology - thanks, Dr. Para March   Referral placed. Pt notified by telephone call.

## 2021-07-06 ENCOUNTER — Telehealth: Payer: Self-pay | Admitting: Hematology and Oncology

## 2021-07-06 NOTE — Telephone Encounter (Signed)
Scheduled appt per 10/6 referral. Pt is aware of appt date and time.  

## 2021-07-08 ENCOUNTER — Encounter: Payer: Self-pay | Admitting: *Deleted

## 2021-07-08 ENCOUNTER — Other Ambulatory Visit: Payer: Self-pay | Admitting: *Deleted

## 2021-07-08 ENCOUNTER — Ambulatory Visit: Payer: Medicaid Other | Admitting: *Deleted

## 2021-07-08 ENCOUNTER — Ambulatory Visit: Payer: Medicaid Other | Attending: Obstetrics

## 2021-07-08 ENCOUNTER — Other Ambulatory Visit: Payer: Self-pay

## 2021-07-08 VITALS — BP 124/79 | HR 91

## 2021-07-08 DIAGNOSIS — O23593 Infection of other part of genital tract in pregnancy, third trimester: Secondary | ICD-10-CM | POA: Diagnosis present

## 2021-07-08 DIAGNOSIS — O09522 Supervision of elderly multigravida, second trimester: Secondary | ICD-10-CM | POA: Diagnosis not present

## 2021-07-08 DIAGNOSIS — O99019 Anemia complicating pregnancy, unspecified trimester: Secondary | ICD-10-CM | POA: Insufficient documentation

## 2021-07-08 DIAGNOSIS — O09293 Supervision of pregnancy with other poor reproductive or obstetric history, third trimester: Secondary | ICD-10-CM

## 2021-07-08 DIAGNOSIS — O09523 Supervision of elderly multigravida, third trimester: Secondary | ICD-10-CM | POA: Insufficient documentation

## 2021-07-08 DIAGNOSIS — O0990 Supervision of high risk pregnancy, unspecified, unspecified trimester: Secondary | ICD-10-CM

## 2021-07-08 DIAGNOSIS — O99213 Obesity complicating pregnancy, third trimester: Secondary | ICD-10-CM | POA: Diagnosis not present

## 2021-07-08 DIAGNOSIS — D563 Thalassemia minor: Secondary | ICD-10-CM | POA: Insufficient documentation

## 2021-07-08 DIAGNOSIS — Z6832 Body mass index (BMI) 32.0-32.9, adult: Secondary | ICD-10-CM

## 2021-07-08 DIAGNOSIS — A5901 Trichomonal vulvovaginitis: Secondary | ICD-10-CM | POA: Insufficient documentation

## 2021-07-08 DIAGNOSIS — Z148 Genetic carrier of other disease: Secondary | ICD-10-CM | POA: Insufficient documentation

## 2021-07-08 DIAGNOSIS — Z3A31 31 weeks gestation of pregnancy: Secondary | ICD-10-CM

## 2021-07-08 DIAGNOSIS — Z362 Encounter for other antenatal screening follow-up: Secondary | ICD-10-CM

## 2021-07-08 DIAGNOSIS — O09299 Supervision of pregnancy with other poor reproductive or obstetric history, unspecified trimester: Secondary | ICD-10-CM | POA: Insufficient documentation

## 2021-07-08 DIAGNOSIS — O99212 Obesity complicating pregnancy, second trimester: Secondary | ICD-10-CM | POA: Insufficient documentation

## 2021-07-08 DIAGNOSIS — O99333 Smoking (tobacco) complicating pregnancy, third trimester: Secondary | ICD-10-CM

## 2021-07-08 NOTE — BH Specialist Note (Signed)
Integrated Behavioral Health via Telemedicine Visit  07/08/2021 Kathryn Livingston [id number]  Number of Integrated Behavioral Health visits: 2 Session Start time: 8:24  Session End time: 8:41 Total time:  17  Referring Provider: Tommi Emery, CNM Patient/Family location: Home [facility] Provider location: Center for [facility] at [facility]  All persons participating in visit: [name] and Kathryn Livingston   Types of Service: Individual psychotherapy and Telephone visit  I connected with Kathryn Livingston and/or Kathryn Livingston's  n/a  via  Telephone or Video Enabled Telemedicine Application  (Video is Caregility application) and verified that I am speaking with the correct person using two identifiers. Discussed confidentiality: Yes   I discussed the limitations of telemedicine and the availability of in person appointments.  Discussed there is a possibility of technology failure and discussed alternative modes of communication if that failure occurs.  I discussed that engaging in this telemedicine visit, they consent to the provision of behavioral healthcare and the services will be billed under their insurance.  Patient and/or legal guardian expressed understanding and consented to Telemedicine visit: Yes   Presenting Concerns: Patient and/or family reports the following symptoms/concerns: Worry about needing iron infusion, as well as concern about method of tubal ligation; no other concern at this time.  Duration of problem: Current pregnancy; Severity of problem: mild  Patient and/or Family's Strengths/Protective Factors: Social connections, Concrete supports in place (healthy food, safe environments, etc.), and Sense of purpose  Goals Addressed: Patient will:  Reduce symptoms of: anxiety and stress   Demonstrate ability to: Increase healthy adjustment to current life circumstances  Progress towards  Goals: Ongoing  Interventions: Interventions utilized:  Psychoeducation and/or Health Education and Supportive Reflection Standardized Assessments completed: Not Needed  Patient and/or Family Response: Pt agrees with treatment plan  Assessment: Patient currently experiencing Other specified counseling.   Patient may benefit from continued psychoeducation and brief therapeutic interventions regarding coping with symptoms of anxiety and life stress .  Plan: Follow up with behavioral health clinician on : One month Behavioral recommendations:  -Continue taking prenatal vitamin as recommended -Continue with plan to discuss medical concerns at upcoming medical appointment Referral(s): Integrated [employer] (In Clinic)  I discussed the assessment and treatment plan with the patient and/or parent/guardian. They were provided an opportunity to ask questions and all were answered. They agreed with the plan and demonstrated an understanding of the instructions.   They were advised to call back or seek an in-person evaluation if the symptoms worsen or if the condition fails to improve as anticipated.  Valetta Close Kathryn Baxley, LCSW

## 2021-07-09 ENCOUNTER — Other Ambulatory Visit: Payer: Self-pay | Admitting: *Deleted

## 2021-07-09 ENCOUNTER — Inpatient Hospital Stay: Payer: Medicaid Other

## 2021-07-09 ENCOUNTER — Inpatient Hospital Stay: Payer: Medicaid Other | Attending: Hematology and Oncology | Admitting: Hematology and Oncology

## 2021-07-09 VITALS — BP 129/69 | HR 112 | Temp 98.7°F | Resp 18 | Wt 204.8 lb

## 2021-07-09 DIAGNOSIS — F209 Schizophrenia, unspecified: Secondary | ICD-10-CM | POA: Insufficient documentation

## 2021-07-09 DIAGNOSIS — Z803 Family history of malignant neoplasm of breast: Secondary | ICD-10-CM | POA: Insufficient documentation

## 2021-07-09 DIAGNOSIS — O99019 Anemia complicating pregnancy, unspecified trimester: Secondary | ICD-10-CM

## 2021-07-09 DIAGNOSIS — F319 Bipolar disorder, unspecified: Secondary | ICD-10-CM | POA: Diagnosis not present

## 2021-07-09 DIAGNOSIS — Z808 Family history of malignant neoplasm of other organs or systems: Secondary | ICD-10-CM | POA: Insufficient documentation

## 2021-07-09 DIAGNOSIS — F1011 Alcohol abuse, in remission: Secondary | ICD-10-CM | POA: Diagnosis not present

## 2021-07-09 DIAGNOSIS — D649 Anemia, unspecified: Secondary | ICD-10-CM | POA: Insufficient documentation

## 2021-07-09 DIAGNOSIS — F17210 Nicotine dependence, cigarettes, uncomplicated: Secondary | ICD-10-CM | POA: Diagnosis not present

## 2021-07-09 LAB — RETIC PANEL
Immature Retic Fract: 13.8 % (ref 2.3–15.9)
RBC.: 2.78 MIL/uL — ABNORMAL LOW (ref 3.87–5.11)
Retic Count, Absolute: 72.6 10*3/uL (ref 19.0–186.0)
Retic Ct Pct: 2.6 % (ref 0.4–3.1)
Reticulocyte Hemoglobin: 34.3 pg (ref >27.9)

## 2021-07-09 LAB — CBC WITH DIFFERENTIAL (CANCER CENTER ONLY)
Abs Immature Granulocytes: 0.02 10*3/uL (ref 0.00–0.07)
Basophils Absolute: 0 10*3/uL (ref 0.0–0.1)
Basophils Relative: 0 %
Eosinophils Absolute: 0.1 10*3/uL (ref 0.0–0.5)
Eosinophils Relative: 1 %
HCT: 26.2 % — ABNORMAL LOW (ref 36.0–46.0)
Hemoglobin: 8.7 g/dL — ABNORMAL LOW (ref 12.0–15.0)
Immature Granulocytes: 0 %
Lymphocytes Relative: 29 %
Lymphs Abs: 2 10*3/uL (ref 0.7–4.0)
MCH: 30.9 pg (ref 26.0–34.0)
MCHC: 33.2 g/dL (ref 30.0–36.0)
MCV: 92.9 fL (ref 80.0–100.0)
Monocytes Absolute: 0.3 10*3/uL (ref 0.1–1.0)
Monocytes Relative: 5 %
Neutro Abs: 4.5 10*3/uL (ref 1.7–7.7)
Neutrophils Relative %: 65 %
Platelet Count: 303 10*3/uL (ref 150–400)
RBC: 2.82 MIL/uL — ABNORMAL LOW (ref 3.87–5.11)
RDW: 13 % (ref 11.5–15.5)
WBC Count: 6.9 10*3/uL (ref 4.0–10.5)
nRBC: 0 % (ref 0.0–0.2)

## 2021-07-09 NOTE — Progress Notes (Signed)
[facility] [phone]   [phone]  INITIAL CONSULT NOTE  Patient Care Team: Pcp, No as PCP - General Blount, Viviana Simpler, MD (Inactive) (General Surgery)  Hematological/Oncological History # Anemia in Pregnancy  01/28/2021: WBC 8.0, Hgb 10.2, MCV 92.4, Plt 330 02/19/2021: WBC 6.2, Hgb 9.6, MCV 93, Plt 318 06/12/2021: WBC 7.2, Hgb 9.0, MCV 91, Plt 287 06/29/2021: Ferritin 218 07/09/2021: establish care with Dr. Leonides Schanz   CHIEF COMPLAINTS/PURPOSE OF CONSULTATION:  "Anemia in Pregnancy "  HISTORY OF PRESENTING ILLNESS:  Kathryn Livingston 35 y.o. female with medical history significant for EtOH abuse, obesity, schizophrenia, and bipolar disorder who presents for evaluation of a microcytic anemia in pregnancy.   On review of the previous records Mrs. Eagleson had a CBC drawn on 01/29/2019 which showed hemoglobin 10.2 and MCV of 92.4.  Most recently on 06/13/2019 the patient had a hemoglobin 9.0 with an MCV of 91 and a ferritin 218.  Due to concern for these findings the patient was referred to hematology for further evaluation.  On exam today Mrs. Nathaniel reports that she is here because of her "iron".  She notes that she is currently taking iron pills because her constipation but no nausea or vomiting.  She has been taking them for a few months now and this is the first time she has ever been prescribed iron pills.  She notes that this is her sixth child and the others have been female.  She is happy to be finally having a boy.  She does endorse having history of thalassemia but is unsure when this was diagnosed or what the diagnosis means.  On further discussion she notes that she does have heavy menstrual cycles lasting 4 to 5 days with heaviest day she goes through 5 pads.  She notes that she does eat iron rich food including beef.  She does endorse being a cigarette smoker smoking approximately 3 to 4 cigarettes/day.  She also smokes marijuana daily about 2-3  times.  She is to not currently drinking alcohol as she is pregnant.  She currently works in the [employer] in [place].  She has no family history markable for any blood disorders but does note her grandmother passed away of cervical cancer her aunt died of breast cancer.  Most of his well mother side.  She notes that she has been tired and she is having difficulty sleeping.  She denies any issues with shortness of breath.  A full 10 point ROS is listed below.  MEDICAL HISTORY:  Past Medical History:  Diagnosis Date   Alcohol abuse 04/11/2016   Bipolar 1 disorder (HCC)    Breast abscess    Depression    doing ok   Headache    Low maternal weight gain    Obesity    Schizophrenia (HCC)     SURGICAL HISTORY: Past Surgical History:  Procedure Laterality Date   DILATION AND CURETTAGE OF UTERUS      SOCIAL HISTORY: Social History   Socioeconomic History   Marital status: Single    Spouse name: Not on file   Number of children: Not on file   Years of education: Not on file   Highest education level: Not on file  Occupational History   Not on file  Tobacco Use   Smoking status: Some Days    Packs/day: 0.25    Years: 15.00    Pack years: 3.75    Types: Cigarettes   Smokeless tobacco: Never  Vaping Use   Vaping Use: Never used  Substance and Sexual Activity   Alcohol use: Not Currently    Comment: 1 beer   Drug use: Yes    Frequency: 7.0 times per week    Types: Marijuana   Sexual activity: Yes    Birth control/protection: None  Other Topics Concern   Not on file  Social History Narrative   ** Merged History Encounter **       Social Determinants of Health   Financial Resource Strain: Not on file  Food Insecurity: Food Insecurity Present   Worried About Programme researcher, broadcasting/film/video in the Last Year: Sometimes true   Ran Out of Food in the Last Year: Sometimes true  Transportation Needs: No Transportation Needs   Lack of Transportation (Medical): No   Lack of  Transportation (Non-Medical): No  Physical Activity: Not on file  Stress: Not on file  Social Connections: Not on file  Intimate Partner Violence: Not on file    FAMILY HISTORY: Family History  Problem Relation Age of Onset   Hypertension Mother    Stroke Mother    Diabetes Mother    Hypertension Father    Cancer Paternal Aunt    Diabetes Maternal Aunt     ALLERGIES:  is allergic to nicoderm [nicotine].  MEDICATIONS:  Current Outpatient Medications  Medication Sig Dispense Refill   docusate sodium (COLACE) 100 MG capsule Take 1 capsule (100 mg total) by mouth 2 (two) times daily. 30 capsule 0   ferrous sulfate (FERROUSUL) 325 (65 FE) MG tablet Take 1 tablet (325 mg total) by mouth 2 (two) times daily. 60 tablet 1   nicotine polacrilex (NICORETTE) 2 MG gum Take 1 each (2 mg total) by mouth as needed for smoking cessation. 100 tablet 1   ondansetron (ZOFRAN ODT) 4 MG disintegrating tablet Take 1 tablet (4 mg total) by mouth every 6 (six) hours as needed for nausea. 20 tablet 0   Prenatal Vit-Fe Fumarate-FA (PREPLUS) 27-1 MG TABS Take 1 tablet by mouth daily. 30 tablet 13   No current facility-administered medications for this visit.    REVIEW OF SYSTEMS:   Constitutional: ( - ) fevers, ( - )  chills , ( - ) night sweats Eyes: ( - ) blurriness of vision, ( - ) double vision, ( - ) watery eyes Ears, nose, mouth, throat, and face: ( - ) mucositis, ( - ) sore throat Respiratory: ( - ) cough, ( - ) dyspnea, ( - ) wheezes Cardiovascular: ( - ) palpitation, ( - ) chest discomfort, ( - ) lower extremity swelling Gastrointestinal:  ( - ) nausea, ( - ) heartburn, ( - ) change in bowel habits Skin: ( - ) abnormal skin rashes Lymphatics: ( - ) new lymphadenopathy, ( - ) easy bruising Neurological: ( - ) numbness, ( - ) tingling, ( - ) new weaknesses Behavioral/Psych: ( - ) mood change, ( - ) new changes  All other systems were reviewed with the patient and are negative.  PHYSICAL  EXAMINATION: Vitals:   07/09/21 1306  BP: 129/69  Pulse: (!) 112  Resp: 18  Temp: 98.7 F (37.1 C)  SpO2: 92%   Filed Weights   07/09/21 1306  Weight: 204 lb 12.8 oz (92.9 kg)    GENERAL: well appearing pregnant middle-aged African-American female in NAD  SKIN: skin color, texture, turgor are normal, no rashes or significant lesions EYES: conjunctiva are pink and non-injected, sclera clear LUNGS: clear to auscultation  and percussion with normal breathing effort HEART: regular rate & rhythm and no murmurs and no lower extremity edema ABDOMEN: gravid uterus.  Musculoskeletal: no cyanosis of digits and no clubbing  PSYCH: alert & oriented x 3, fluent speech NEURO: no focal motor/sensory deficits  LABORATORY DATA:  I have reviewed the data as listed CBC Latest Ref Rng & Units 06/12/2021 04/20/2021 02/19/2021  WBC 3.4 - 10.8 x10E3/uL 7.2 - 6.2  Hemoglobin 11.1 - 15.9 g/dL 9.0(L) 9.2(L) 9.6(L)  Hematocrit 34.0 - 46.6 % 27.2(L) 28.3(L) 28.5(L)  Platelets 150 - 450 x10E3/uL 287 - 318    CMP Latest Ref Rng & Units 04/20/2021 01/28/2021 09/04/2018  Glucose 70 - 99 mg/dL 86 91 84  BUN 6 - 20 mg/dL <9(H) <7(S) 8  Creatinine 0.44 - 1.00 mg/dL 1.42 3.95 3.20  Sodium 135 - 145 mmol/L 135 133(L) 142  Potassium 3.5 - 5.1 mmol/L 3.3(L) 3.6 3.9  Chloride 98 - 111 mmol/L 105 102 108  CO2 22 - 32 mmol/L 21(L) 24 24  Calcium 8.9 - 10.3 mg/dL 8.9 9.0 2.3(X)  Total Protein 6.5 - 8.1 g/dL - 6.6 -  Total Bilirubin 0.3 - 1.2 mg/dL - 0.6 -  Alkaline Phos 38 - 126 U/L - 51 -  AST 15 - 41 U/L - 13(L) -  ALT 0 - 44 U/L - 11 -    RADIOGRAPHIC STUDIES: I have personally reviewed the radiological images as listed and agreed with the findings in the report. [place] MFM OB FOLLOW UP  Result Date: 07/08/2021 ----------------------------------------------------------------------  OBSTETRICS REPORT                       (Signed Final 07/08/2021 09:32 am)  ---------------------------------------------------------------------- Patient Info  ID #:       [id number]                          D.O.B.:  June 05, 1986 (35 yrs)  Name:       Kathryn Livingston [facility]                 Visit Date: 07/08/2021 08:33 am ---------------------------------------------------------------------- Performed By  Attending:        Ma Rings MD         Ref. Address:     [address]                                                             [place], [place]                                                             [address]  Performed By:     Tommie Raymond BS,       [facility]  Referred By:      [facility] ---------------------------------------------------------------------- Orders  #  Description                           Code        Ordered By  1  [place] MFM OB FOLLOW UP                   [phone]    Rosana Hoes ----------------------------------------------------------------------  #  Order #                     Accession #                Episode #  1  [id number]                   [id number]                 [id number] ---------------------------------------------------------------------- Indications  Poor obstetric history: Previous fetal growth  O09.299  restriction (FGR)  [redacted] weeks gestation of pregnancy                Z3A.62  Advanced maternal age multigravida 50,         O61.522  second trimester  Obesity complicating pregnancy, second         O99.212  trimester (pregravid BMI 32)  Antenatal screening for malformations          Z36.3  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 Cord Insertion:      Previously Visualized  Amniotic Fluid  AFI FV:      Subjectively low-normal  AFI Sum(cm)     %Tile       Largest Pocket(cm)  9               8           4.1  RUQ(cm)       RLQ(cm)       LUQ(cm)        LLQ(cm)  4.1           2.1           1.5            1.3 ---------------------------------------------------------------------- Biometry  BPD:      77.1  mm     G. Age:  31w 0d         19  %    CI:        74.28   %    70 - 86                                                          FL/HC:      22.8   %    19.1 - 21.3  HC:       284   mm     G. Age:  31w 1d          7  %    HC/AC:      1.08        0.96 - 1.17  AC:      263.5  mm     G. Age:  30w 3d         15  %    FL/BPD:     84.0   %    71 - 87  FL:       64.8  mm     G. Age:  33w 3d         81  %    FL/AC:      24.6   %    20 - 24  CER:      41.2  mm     G. Age:  32w 6d         61  %  LV:          6  mm  Est. FW:    1782  gm    3 lb 15 oz      32  % ---------------------------------------------------------------------- OB History  Gravidity:    6         Term:   4        Prem:   0        SAB:   1  TOP:          0       Ectopic:  0        Living: 4 ---------------------------------------------------------------------- Gestational Age  LMP:           31w 5d        Date:  11/28/20                 EDD:   09/04/21  U/S Today:     31w 4d                                        EDD:   09/05/21  Best:          31w 5d     Det. By:  LMP  (11/28/20)          EDD:   09/04/21 ---------------------------------------------------------------------- Anatomy  Cranium:               Appears normal         LVOT:                   Appears normal  Cavum:                 Appears normal         Aortic Arch:            Appears normal  Ventricles:            Appears normal         Ductal Arch:            Previously seen  Choroid Plexus:        Previously seen        Diaphragm:               Appears normal  Cerebellum:            Appears normal         Stomach:                Appears normal, left                                                                        sided  Posterior Fossa:       Previously seen        Abdomen:                Previously seen  Nuchal Fold:           Not applicable (>20    Abdominal Wall:         Appears nml (cord                         wks GA)                                        insert, abd wall)  Face:                  Orbits and profile     Cord Vessels:           Appears normal (3                         previously seen                                vessel cord)  Lips:                  Previously seen        Kidneys:                Appear normal  Palate:                Previously seen        Bladder:                Appears normal  Thoracic:              Appears normal         Spine:                  Previously  seen  Heart:                 Appears normal         Upper Extremities:      Previously seen                         (4CH, axis, and                         situs)  RVOT:                  Appears normal         Lower Extremities:      Previously seen  Other:  Female gender previously seen. Nasal Bone, Lenses, VC, 3VV and          3VTV visualized previously. Technically difficult due to advanced GA          and fetal position. ---------------------------------------------------------------------- Cervix Uterus Adnexa  Cervix  Not visualized (advanced GA >24wks)  Uterus  No abnormality visualized.  Right Ovary  Within normal limits.  Left Ovary  Within normal limits.  Cul De Sac  No free fluid seen.  Adnexa  No abnormality visualized. ---------------------------------------------------------------------- Comments  This patient was seen for a follow up growth scan due to a  prior pregnancy that was complicated by fetal growth  restriction.  She denies any problems since her last exam.  She was informed that the fetal growth and amniotic fluid  level appears  appropriate for her gestational age.  Due to her history of a prior IUGR fetus, a follow-up growth  scan was scheduled in 4 weeks. ----------------------------------------------------------------------                   Ma Rings, MD Electronically Signed Final Report   07/08/2021 09:32 am ----------------------------------------------------------------------   ASSESSMENT & PLAN Minda Ditto Sabic 35 y.o. female with medical history significant for EtOH abuse, obesity, schizophrenia, and bipolar disorder who presents for evaluation of a microcytic anemia in pregnancy.   After review of the labs, review of the records, and discussion with the patient the patients findings are most consistent with a normocytic anemia in pregnancy.  Patient does carry a diagnosis of thalassemia, though we often see this with a microcytosis.  Additionally the patient is currently on iron pills though her ferritin levels are greater than 100.  We will order a full work-up to include hemoglobin electrophoresis, alpha globulin gene testing, and iron panel in order to further evaluate this patient's anemia.  Return to clinic will depend on the results of the below studies.  # Anemia in Pregnancy -- Findings are consistent with a mild normocytic anemia in pregnancy. --Normal hemoglobin pregnancy is 10.5 and above.  Patient is currently 9.0 --Prior ferritin levels do not show evidence of iron deficiency anemia.  We will reorder iron panel and ferritin today --Patient carries a history of alpha thalassemia.  Order hemoglobin electrophoresis as well as alpha globulin gene testing today in order to confirm this diagnosis --No indication for transfusion at this time.  If patient is found to be iron deficient we will need to consider IV iron --Recommend maintaining hemoglobin greater than 8.0.  Would recommend checking prior to delivery in order to transfuse and make sure she has not blood or safe delivery. --RTC pending results of  the above studies   Orders Placed This Encounter  Procedures  CBC with Differential (Cancer Center Only)    Standing Status:   Future    Number of Occurrences:   1    Standing Expiration Date:   07/09/2022   CMP (Cancer Center only)    Standing Status:   Future    Number of Occurrences:   1    Standing Expiration Date:   07/09/2022   Retic Panel    Standing Status:   Future    Number of Occurrences:   1    Standing Expiration Date:   07/09/2022   Iron and TIBC    Standing Status:   Future    Number of Occurrences:   1    Standing Expiration Date:   07/09/2022   Ferritin    Standing Status:   Future    Number of Occurrences:   1    Standing Expiration Date:   07/09/2022   Hgb Fractionation Cascade    Standing Status:   Future    Number of Occurrences:   1    Standing Expiration Date:   07/09/2022   Alpha-Thalassemia GenotypR    Standing Status:   Future    Number of Occurrences:   1    Standing Expiration Date:   07/09/2022    All questions were answered. The patient knows to call the clinic with any problems, questions or concerns.  A total of more than 60 minutes were spent on this encounter with face-to-face time and non-face-to-face time, including preparing to see the patient, ordering tests and/or medications, counseling the patient and coordination of care as outlined above.   Ulysees Barns, MD Department of Hematology/Oncology [facility] Phone: [phone] Pager: [phone] Email: [email or web address]  07/09/2021 1:43 PM

## 2021-07-10 ENCOUNTER — Other Ambulatory Visit: Payer: Medicaid Other

## 2021-07-10 ENCOUNTER — Telehealth: Payer: Self-pay | Admitting: Hematology and Oncology

## 2021-07-10 LAB — CMP (CANCER CENTER ONLY)
ALT: 12 U/L (ref 0–44)
AST: 16 U/L (ref 15–41)
Albumin: 3.2 g/dL — ABNORMAL LOW (ref 3.5–5.0)
Alkaline Phosphatase: 59 U/L (ref 38–126)
Anion gap: 7 (ref 5–15)
BUN: <5 mg/dL — ABNORMAL LOW (ref 6–20)
CO2: 22 mmol/L (ref 22–32)
Calcium: 8.5 mg/dL — ABNORMAL LOW (ref 8.9–10.3)
Chloride: 106 mmol/L (ref 98–111)
Creatinine: 0.5 mg/dL (ref 0.44–1.00)
GFR, Estimated: >60 mL/min (ref >60)
Glucose, Bld: 108 mg/dL — ABNORMAL HIGH (ref 70–99)
Potassium: 3.5 mmol/L (ref 3.5–5.1)
Sodium: 135 mmol/L (ref 135–145)
Total Bilirubin: 0.4 mg/dL (ref 0.3–1.2)
Total Protein: 6.5 g/dL (ref 6.5–8.1)

## 2021-07-10 NOTE — Telephone Encounter (Signed)
Rescheduled per 10/14 in basket, pt is aware of change

## 2021-07-11 LAB — HGB FRACTIONATION CASCADE
Hgb A2: 2.6 % (ref 1.8–3.2)
Hgb A: 97.4 % (ref 96.4–98.8)
Hgb F: 0 % (ref 0.0–2.0)
Hgb S: 0 %

## 2021-07-13 ENCOUNTER — Encounter: Payer: Medicaid Other | Admitting: Student

## 2021-07-15 ENCOUNTER — Other Ambulatory Visit: Payer: Medicaid Other

## 2021-07-16 ENCOUNTER — Ambulatory Visit (INDEPENDENT_AMBULATORY_CARE_PROVIDER_SITE_OTHER): Payer: Medicaid Other | Admitting: Clinical

## 2021-07-16 DIAGNOSIS — Z7189 Other specified counseling: Secondary | ICD-10-CM

## 2021-07-20 ENCOUNTER — Inpatient Hospital Stay (HOSPITAL_COMMUNITY)
Admission: AD | Admit: 2021-07-20 | Discharge: 2021-07-20 | Disposition: A | Discharge status: Home / Self Care | Payer: Medicaid Other | Attending: Family Medicine | Admitting: Family Medicine

## 2021-07-20 ENCOUNTER — Other Ambulatory Visit: Payer: Self-pay

## 2021-07-20 ENCOUNTER — Encounter (HOSPITAL_COMMUNITY): Payer: Self-pay | Admitting: Family Medicine

## 2021-07-20 DIAGNOSIS — Z3A33 33 weeks gestation of pregnancy: Secondary | ICD-10-CM

## 2021-07-20 DIAGNOSIS — D563 Thalassemia minor: Secondary | ICD-10-CM

## 2021-07-20 DIAGNOSIS — O09523 Supervision of elderly multigravida, third trimester: Secondary | ICD-10-CM | POA: Diagnosis not present

## 2021-07-20 DIAGNOSIS — M5410 Radiculopathy, site unspecified: Secondary | ICD-10-CM | POA: Diagnosis not present

## 2021-07-20 DIAGNOSIS — M549 Dorsalgia, unspecified: Secondary | ICD-10-CM | POA: Insufficient documentation

## 2021-07-20 DIAGNOSIS — O0990 Supervision of high risk pregnancy, unspecified, unspecified trimester: Secondary | ICD-10-CM

## 2021-07-20 DIAGNOSIS — O26893 Other specified pregnancy related conditions, third trimester: Secondary | ICD-10-CM | POA: Diagnosis not present

## 2021-07-20 DIAGNOSIS — A5901 Trichomonal vulvovaginitis: Secondary | ICD-10-CM

## 2021-07-20 DIAGNOSIS — O23593 Infection of other part of genital tract in pregnancy, third trimester: Secondary | ICD-10-CM

## 2021-07-20 DIAGNOSIS — M25551 Pain in right hip: Secondary | ICD-10-CM | POA: Diagnosis present

## 2021-07-20 DIAGNOSIS — O99333 Smoking (tobacco) complicating pregnancy, third trimester: Secondary | ICD-10-CM | POA: Diagnosis not present

## 2021-07-20 DIAGNOSIS — F17210 Nicotine dependence, cigarettes, uncomplicated: Secondary | ICD-10-CM | POA: Diagnosis not present

## 2021-07-20 DIAGNOSIS — O99019 Anemia complicating pregnancy, unspecified trimester: Secondary | ICD-10-CM

## 2021-07-20 LAB — ALPHA-THALASSEMIA GENOTYPR

## 2021-07-20 MED ORDER — CYCLOBENZAPRINE HCL 5 MG PO TABS
10.0000 mg | ORAL_TABLET | Freq: Once | ORAL | Status: AC
  Administered 2021-07-20: 10 mg via ORAL
  Filled 2021-07-20: qty 2

## 2021-07-20 MED ORDER — CYCLOBENZAPRINE HCL 10 MG PO TABS: 10.0000 mg | ORAL_TABLET | Freq: Three times a day (TID) | ORAL | 2 refills | Status: DC | PRN

## 2021-07-20 NOTE — MAU Note (Addendum)
...  Kathryn Livingston is a 35 y.o. at [redacted]w[redacted]d here in MAU via EMS reporting: New onset right hip pain that began this past Friday, October 21st. She states she was sitting and she felt a sharp shooting pain in her hip that goes down to her knee. She states this pain gets worse when she walks. Has not taken any pain medication. She states she tried to call her OB office this morning but no one answered so she decided to call EMS. She is endorsing a HA that began at 0500 as well. +FM. No VB or LOF.    Pain score:  10/10 right hip pain 8/10 HA  Vitals:   07/20/21 0920 07/20/21 0929  BP: 127/72 124/77  Pulse: 88 89  Resp: 19   Temp: 97.6 F (36.4 C)   SpO2: 100%      FHT: 155 initial external

## 2021-07-20 NOTE — MAU Provider Note (Signed)
History     CSN: [id number]  Arrival date and time: 07/20/21 4098   Event Date/Time   First Provider Initiated Contact with Patient 07/20/21 [phone]      Chief Complaint  Patient presents with   Hip Pain   Leg Pain   HPI This is a 35yo [id number] at [redacted]w[redacted]d who presents via EMS for sudden onset of right hip pain. Pain started a couple hours ago, rates 10/10. Ratiates down her anterior thigh to her knee. Moving increases pain. No palliating factors.   No contractions, leaking fluid, vaginal bleeding. No decreased fetal movement  OB History     Gravida  6   Para  4   Term  4   Preterm  0   AB  1   Living  4      SAB  1   IAB  0   Ectopic  0   Multiple  0   Live Births  4           Past Medical History:  Diagnosis Date   Alcohol abuse 04/11/2016   Bipolar 1 disorder (HCC)    Breast abscess    Depression    doing ok   Headache    Low maternal weight gain    Obesity    Schizophrenia (HCC)     Past Surgical History:  Procedure Laterality Date   DILATION AND CURETTAGE OF UTERUS      Family History  Problem Relation Age of Onset   Hypertension Mother    Stroke Mother    Diabetes Mother    Hypertension Father    Cancer Paternal Aunt    Diabetes Maternal Aunt     Social History   Tobacco Use   Smoking status: Every Day    Packs/day: 0.25    Years: 15.00    Pack years: 3.75    Types: Cigarettes   Smokeless tobacco: Never  Vaping Use   Vaping Use: Never used  Substance Use Topics   Alcohol use: Not Currently    Comment: 1 beer   Drug use: Yes    Frequency: 7.0 times per week    Types: Marijuana    Comment: last used marijuana 07/19/2021 as of 07/20/2021    Allergies:  Allergies  Allergen Reactions   Nicoderm [Nicotine]     Medications Prior to Admission  Medication Sig Dispense Refill Last Dose   Prenatal Vit-Fe Fumarate-FA (PREPLUS) 27-1 MG TABS Take 1 tablet by mouth daily. 30 tablet 13 07/19/2021   docusate sodium (COLACE)  100 MG capsule Take 1 capsule (100 mg total) by mouth 2 (two) times daily. 30 capsule 0    ferrous sulfate (FERROUSUL) 325 (65 FE) MG tablet Take 1 tablet (325 mg total) by mouth 2 (two) times daily. 60 tablet 1    nicotine polacrilex (NICORETTE) 2 MG gum Take 1 each (2 mg total) by mouth as needed for smoking cessation. 100 tablet 1    ondansetron (ZOFRAN ODT) 4 MG disintegrating tablet Take 1 tablet (4 mg total) by mouth every 6 (six) hours as needed for nausea. 20 tablet 0     Review of Systems Physical Exam   Blood pressure 124/77, pulse 89, temperature 97.6 F (36.4 C), temperature source Oral, resp. rate 19, last menstrual period 11/28/2020, SpO2 100 %, unknown if currently breastfeeding.  Physical Exam Vitals reviewed.  Constitutional:      Appearance: Normal appearance.  Cardiovascular:     Rate and Rhythm: Normal rate and  regular rhythm.  Musculoskeletal:     Comments: Tenderness to palpation in right lower back, SI joint. Increased discomfort with FABER.   Skin:    General: Skin is warm and dry.     Capillary Refill: Capillary refill takes less than 2 seconds.  Neurological:     General: No focal deficit present.     Mental Status: She is alert.  Psychiatric:        Mood and Affect: Mood normal.        Behavior: Behavior normal.        Thought Content: Thought content normal.        Judgment: Judgment normal.    MAU Course  Procedures NST:  Baseline: 130  Variability: moderate Accelerations: ++  Decelerations: none Contractions: none  MDM Will give flexeril  Assessment and Plan   [redacted]weeks gestation Radicular back pain  Improved with flexeril Continue tylenol. Add heat/ice    Levie Heritage 07/20/2021, 10:00 AM

## 2021-07-29 ENCOUNTER — Other Ambulatory Visit (HOSPITAL_COMMUNITY)
Admission: RE | Admit: 2021-07-29 | Discharge: 2021-07-29 | Disposition: A | Discharge status: Home / Self Care | Payer: Medicaid Other | Source: Ambulatory Visit | Attending: Family Medicine | Admitting: Family Medicine

## 2021-07-29 ENCOUNTER — Other Ambulatory Visit: Payer: Self-pay

## 2021-07-29 ENCOUNTER — Ambulatory Visit (INDEPENDENT_AMBULATORY_CARE_PROVIDER_SITE_OTHER): Payer: Medicaid Other | Admitting: Family Medicine

## 2021-07-29 ENCOUNTER — Encounter: Payer: Self-pay | Admitting: Family Medicine

## 2021-07-29 VITALS — BP 127/75 | HR 85 | Wt 203.0 lb

## 2021-07-29 DIAGNOSIS — O0990 Supervision of high risk pregnancy, unspecified, unspecified trimester: Secondary | ICD-10-CM

## 2021-07-29 DIAGNOSIS — A5901 Trichomonal vulvovaginitis: Secondary | ICD-10-CM | POA: Insufficient documentation

## 2021-07-29 DIAGNOSIS — O23593 Infection of other part of genital tract in pregnancy, third trimester: Secondary | ICD-10-CM

## 2021-07-29 DIAGNOSIS — F323 Major depressive disorder, single episode, severe with psychotic features: Secondary | ICD-10-CM

## 2021-07-29 DIAGNOSIS — O99019 Anemia complicating pregnancy, unspecified trimester: Secondary | ICD-10-CM

## 2021-07-29 DIAGNOSIS — O09523 Supervision of elderly multigravida, third trimester: Secondary | ICD-10-CM

## 2021-07-29 NOTE — Patient Instructions (Signed)

## 2021-07-29 NOTE — Progress Notes (Signed)
Patient stated that "when I walk my hips hurt real bad"

## 2021-07-29 NOTE — Progress Notes (Signed)
   Subjective:  KHARMA SAMPSEL is a 35 y.o. Z6X0960 at [redacted]w[redacted]d being seen today for ongoing prenatal care.  She is currently monitored for the following issues for this low-risk pregnancy and has Continuous tobacco abuse; Marijuana use; History of polyhydramnios; Severe major depression, single episode, with psychotic features (HCC); Supervision of high risk pregnancy, antepartum; Advanced maternal age in multigravida; Anemia during pregnancy; Alpha thalassemia silent carrier; and Trichomonal vaginitis in pregnancy on their problem list.  Patient reports no complaints.  Contractions: Not present. Vag. Bleeding: None.  Movement: Present. Denies leaking of fluid.   The following portions of the patient's history were reviewed and updated as appropriate: allergies, current medications, past family history, past medical history, past social history, past surgical history and problem list. Problem list updated.  Objective:   Vitals:   07/29/21 1458  BP: 127/75  Pulse: 85  Weight: 203 lb (92.1 kg)    Fetal Status: Fetal Heart Rate (bpm): 146   Movement: Present     General:  Alert, oriented and cooperative. Patient is in no acute distress.  Skin: Skin is warm and dry. No rash noted.   Cardiovascular: Normal heart rate noted  Respiratory: Normal respiratory effort, no problems with respiration noted  Abdomen: Soft, gravid, appropriate for gestational age. Pain/Pressure: Present     Pelvic: Vag. Bleeding: None     Cervical exam deferred        Extremities: Normal range of motion.  Edema: None  Mental Status: Normal mood and affect. Normal behavior. Normal judgment and thought content.   Urinalysis:      Assessment and Plan:  Pregnancy: A5W0981 at [redacted]w[redacted]d  1. Supervision of high risk pregnancy, antepartum BP and FHR normal Discussed techniques for PP BTL, she is still wanting this  2. Trichomonal vaginitis during pregnancy in third trimester Reports she took full course TOC today  3.  Anemia during pregnancy Following w Heme Ferritin has been normal, does not recommend further IV iron Alpha thal carrier but no disease Heme recommends keeping hgb>8, transfuse if lower  4. Multigravida of advanced maternal age in third trimester   5. Severe major depression, single episode, with psychotic features (HCC) Doing OK Seeing Jamie  Preterm labor symptoms and general obstetric precautions including but not limited to vaginal bleeding, contractions, leaking of fluid and fetal movement were reviewed in detail with the patient. Please refer to After Visit Summary for other counseling recommendations.  Return in 2 weeks (on 08/12/2021) for [facility], ob visit.   Venora Maples, MD

## 2021-07-30 LAB — CERVICOVAGINAL ANCILLARY ONLY
Bacterial Vaginitis (gardnerella): NEGATIVE
Candida Glabrata: NEGATIVE
Candida Vaginitis: NEGATIVE
Chlamydia: NEGATIVE
Comment: NEGATIVE
Comment: NEGATIVE
Comment: NEGATIVE
Comment: NEGATIVE
Comment: NEGATIVE
Comment: NORMAL
Neisseria Gonorrhea: NEGATIVE
Trichomonas: NEGATIVE

## 2021-08-03 NOTE — BH Specialist Note (Deleted)
Integrated Behavioral Health via Telemedicine Visit  08/03/2021 Kathryn Livingston [id number]  Number of Integrated Behavioral Health visits: *** Session Start time: 8:15***  Session End time: 8:45*** Total time: {IBH Total YKDX:[id number]}  Referring Provider: *** Patient/Family location: Home*** [facility] Provider location: [facility]  All persons participating in visit: Patient *** and [facility] Kathryn Livingston ***  Types of Service: {CHL AMB TYPE OF SERVICE:[phone]}  I connected with Kathryn Livingston and/or Kathryn Livingston's {family members:20773} via  Telephone or Video Enabled Telemedicine Application  (Video is Caregility application) and verified that I am speaking with the correct person using two identifiers. Discussed confidentiality: {YES/NO:21197}  I discussed the limitations of telemedicine and the availability of in person appointments.  Discussed there is a possibility of technology failure and discussed alternative modes of communication if that failure occurs.  I discussed that engaging in this telemedicine visit, they consent to the provision of behavioral healthcare and the services will be billed under their insurance.  Patient and/or legal guardian expressed understanding and consented to Telemedicine visit: {YES/NO:21197}  Presenting Concerns: Patient and/or family reports the following symptoms/concerns: *** Duration of problem: ***; Severity of problem: {Mild/Moderate/Severe:20260}  Patient and/or Family's Strengths/Protective Factors: {CHL AMB BH PROTECTIVE FACTORS:[phone]}  Goals Addressed: Patient will:  Reduce symptoms of: {IBH Symptoms:21014056}   Increase knowledge and/or ability of: {IBH Patient Tools:21014057}   Demonstrate ability to: {IBH Goals:21014053}  Progress towards Goals: {CHL AMB BH PROGRESS TOWARDS GOALS:[phone]}  Interventions: Interventions utilized:  {IBH  Interventions:21014054} Standardized Assessments completed: {IBH Screening Tools:21014051}  Patient and/or Family Response: ***  Assessment: Patient currently experiencing ***.   Patient may benefit from ***.  Plan: Follow up with behavioral health clinician on : *** Behavioral recommendations: *** Referral(s): {IBH Referrals:21014055}  I discussed the assessment and treatment plan with the patient and/or parent/guardian. They were provided an opportunity to ask questions and all were answered. They agreed with the plan and demonstrated an understanding of the instructions.   They were advised to call back or seek an in-person evaluation if the symptoms worsen or if the condition fails to improve as anticipated.  Kathryn Livingston Kathryn Dorian, LCSW

## 2021-08-05 ENCOUNTER — Ambulatory Visit: Payer: Medicaid Other

## 2021-08-12 ENCOUNTER — Other Ambulatory Visit: Payer: Self-pay

## 2021-08-12 ENCOUNTER — Ambulatory Visit: Payer: Medicaid Other | Attending: Obstetrics

## 2021-08-12 ENCOUNTER — Ambulatory Visit: Payer: Medicaid Other | Admitting: *Deleted

## 2021-08-12 VITALS — BP 128/78 | HR 77

## 2021-08-12 DIAGNOSIS — E669 Obesity, unspecified: Secondary | ICD-10-CM

## 2021-08-12 DIAGNOSIS — O99333 Smoking (tobacco) complicating pregnancy, third trimester: Secondary | ICD-10-CM | POA: Insufficient documentation

## 2021-08-12 DIAGNOSIS — O0990 Supervision of high risk pregnancy, unspecified, unspecified trimester: Secondary | ICD-10-CM | POA: Diagnosis present

## 2021-08-12 DIAGNOSIS — Z6832 Body mass index (BMI) 32.0-32.9, adult: Secondary | ICD-10-CM | POA: Diagnosis present

## 2021-08-12 DIAGNOSIS — D563 Thalassemia minor: Secondary | ICD-10-CM | POA: Insufficient documentation

## 2021-08-12 DIAGNOSIS — Z3A36 36 weeks gestation of pregnancy: Secondary | ICD-10-CM

## 2021-08-12 DIAGNOSIS — O09523 Supervision of elderly multigravida, third trimester: Secondary | ICD-10-CM

## 2021-08-12 DIAGNOSIS — O99213 Obesity complicating pregnancy, third trimester: Secondary | ICD-10-CM

## 2021-08-12 DIAGNOSIS — O99019 Anemia complicating pregnancy, unspecified trimester: Secondary | ICD-10-CM | POA: Insufficient documentation

## 2021-08-13 ENCOUNTER — Telehealth (INDEPENDENT_AMBULATORY_CARE_PROVIDER_SITE_OTHER): Payer: Medicaid Other | Admitting: Nurse Practitioner

## 2021-08-13 DIAGNOSIS — O99019 Anemia complicating pregnancy, unspecified trimester: Secondary | ICD-10-CM

## 2021-08-13 DIAGNOSIS — O0990 Supervision of high risk pregnancy, unspecified, unspecified trimester: Secondary | ICD-10-CM

## 2021-08-13 DIAGNOSIS — Z3A36 36 weeks gestation of pregnancy: Secondary | ICD-10-CM

## 2021-08-13 DIAGNOSIS — O09523 Supervision of elderly multigravida, third trimester: Secondary | ICD-10-CM

## 2021-08-13 DIAGNOSIS — O99013 Anemia complicating pregnancy, third trimester: Secondary | ICD-10-CM

## 2021-08-13 DIAGNOSIS — D563 Thalassemia minor: Secondary | ICD-10-CM

## 2021-08-13 NOTE — Progress Notes (Signed)
OBSTETRICS PRENATAL VIRTUAL VISIT ENCOUNTER NOTE  Provider location: [facility] [place] [facility] [place] [facility]   Patient location: Home  I connected with Kathryn Livingston on 08/13/21 at  9:35 AM EST by MyChart Video Encounter and verified that I am speaking with the correct person using two identifiers. I discussed the limitations, risks, security and privacy concerns of performing an evaluation and management service virtually and the availability of in person appointments. I also discussed with the patient that there may be a patient responsible charge related to this service. The patient expressed understanding and agreed to proceed. Subjective:  Kathryn Livingston is a 35 y.o. M6N8177 at 28w6dbeing seen today for ongoing prenatal care.  She is currently monitored for the following issues for this high-risk pregnancy and has Continuous tobacco abuse; Marijuana use; History of polyhydramnios; Severe major depression, single episode, with psychotic features ([place]; Supervision of high risk pregnancy, antepartum; Advanced maternal age in multigravida; Anemia during pregnancy; and Alpha thalassemia silent carrier on their problem list.  Patient reports no complaints.  Contractions: Not present. Vag. Bleeding: None.  Movement: Present. Denies any leaking of fluid.   The following portions of the patient's history were reviewed and updated as appropriate: allergies, current medications, past family history, past medical history, past social history, past surgical history and problem list.   Objective:  There were no vitals filed for this visit.  Fetal Status:     Movement: Present     General:  Alert, oriented and cooperative. Patient is in no acute distress.  Respiratory: Normal respiratory effort, no problems with respiration noted  Mental Status: Normal mood and affect. Normal behavior. Normal judgment and thought content.  Rest of physical exam deferred due to type of  encounter  Imaging: UKoreaMFM OB FOLLOW UP  Result Date: 08/12/2021 ----------------------------------------------------------------------  OBSTETRICS REPORT                       (Signed Final 08/12/2021 08:27 am) ---------------------------------------------------------------------- Patient Info  ID #:       [id number]                         D.O.B.:  [birth year](35 yrs)  [facility]       [facility]                Visit Date: 08/12/2021 07:32 am ---------------------------------------------------------------------- Performed By  Attending:        VJohnell ComingsMD         Ref. Address:     [address]                                                            [place] Performed By:     NDorena Dew    Location[facility]         [facility], [facility] at                                                             [place] for                                                             Women  Referred By:      [facility]                    for Women ---------------------------------------------------------------------- Orders  #  Description                           Code        Ordered By  1  [place] MFM OB FOLLOW UP                   [phone]    Peterson Ao ----------------------------------------------------------------------  #  Order #                     Accession #                Episode #  1  [id number]                   [id number]                 [id number] ---------------------------------------------------------------------- Indications  Poor obstetric history: Previous fetal growth  O09.299  restriction (FGR)  [redacted] weeks gestation of pregnancy                Z3A.10  Advanced maternal age multigravida 51+,        O62.523  third trimester (35)  Obesity complicating pregnancy, third          O99.213  trimester (Pre-pregnancy BMI 32)  Antenatal screening for malformations           Z36.3  Genetic carrier (sickle cell, silent alpha thal) Z14.8  Low risk NIPS, neg AFP ---------------------------------------------------------------------- Fetal Evaluation  Num Of Fetuses:         1  Fetal Heart Rate(bpm):  170  Cardiac Activity:       Observed  Presentation:           Cephalic  Placenta:               Posterior  P. Cord Insertion:      Previously Visualized  Amniotic Fluid  AFI FV:      Within normal limits  AFI Sum(cm)     %Tile       Largest Pocket(cm)  14.27           53  7.13  RUQ(cm)       RLQ(cm)       LUQ(cm)        LLQ(cm)  7.13          0             3.57           3.57 ---------------------------------------------------------------------- Biometry  BPD:      86.3  mm     G. Age:  34w 6d         14  %    CI:        78.04   %    70 - 86                                                          FL/HC:      21.5   %    20.8 - 22.6  HC:      309.1  mm     G. Age:  34w 4d        1.1  %    HC/AC:      0.97        0.92 - 1.05  AC:      317.7  mm     G. Age:  35w 5d         33  %    FL/BPD:     77.1   %    71 - 87  FL:       66.5  mm     G. Age:  34w 2d        3.7  %    FL/AC:      20.9   %    20 - 24  HUM:      60.4  mm     G. Age:  35w 0d         36  %  LV:        6.1  mm  Est. FW:    2590  gm    5 lb 11 oz      16  % ---------------------------------------------------------------------- OB History  Gravidity:    6         Term:   4        Prem:   0        SAB:   1  TOP:          0       Ectopic:  0        Living: 4 ---------------------------------------------------------------------- Gestational Age  LMP:           36w 5d        Date:  11/28/20                 EDD:   09/04/21  U/S Today:     34w 6d                                        EDD:   09/17/21  Best:          36w 5d     Det. By:  LMP  (11/28/20)  EDD:   09/04/21 ---------------------------------------------------------------------- Anatomy  Cranium:               Appears normal         LVOT:                    Previously seen  Cavum:                 Previously seen        Aortic Arch:            Previously seen  Ventricles:            Appears normal         Ductal Arch:            Previously seen  Choroid Plexus:        Previously seen        Diaphragm:              Appears normal  Cerebellum:            Previously seen        Stomach:                Appears normal, left                                                                        sided  Posterior Fossa:       Previously seen        Abdomen:                Previously seen  Nuchal Fold:           Not applicable (>17    Abdominal Wall:         Appears nml (cord                         wks GA)                                        insert, abd wall)  Face:                  Orbits and profile     Cord Vessels:           Previously seen                         previously seen  Lips:                  Previously seen        Kidneys:                Appear normal  Palate:                Previously seen        Bladder:                Appears normal  Thoracic:              Appears normal         Spine:  Previously seen  Heart:                 Previously seen        Upper Extremities:      Previously seen  RVOT:                  Previously seen        Lower Extremities:      Previously seen  Other:  Female gender previously seen. Nasal Bone, Lenses, VC, 3VV and          3VTV visualized previously. Technically difficult due to advanced GA          and fetal position. ---------------------------------------------------------------------- Cervix Uterus Adnexa  Cervix  Not visualized (advanced GA >24wks)  Right Ovary  Previously seen  Left Ovary  Previously seen. ---------------------------------------------------------------------- Comments  This patient was seen for a follow up growth scan as her prior  pregnancy was complicated by fetal growth restriction.  The  patient reports that her last child was delivered at term  weighing six and a half pounds.  She  denies any problems  since her last exam.  She was informed that the fetal growth and amniotic fluid  level appears appropriate for her gestational age.  The overall  EFW measures at the 16th percentile for her gestational age.  As the fetal growth is within normal limits, no further exams  were scheduled in our office. ----------------------------------------------------------------------                   Johnell Comings, MD Electronically Signed Final Report   08/12/2021 08:27 am ----------------------------------------------------------------------   Assessment and Plan:  Pregnancy: [id number] at 46w6d1. Supervision of high risk pregnancy, antepartum Patient had to change to a virtual visit today due to her mother's medical emergency and her mother was taken to the hospital. The patient is doing well and will be going to be with her mother at the hospital Reports the baby is moving well. Her vagina swabs are due and will be done at her next visit  2. Multigravida of advanced maternal age in third trimester   3. Alpha thalassemia silent carrier   4. Anemia during pregnancy Met with hematology on 07-09-21 - recommends HGB above 8.0. May need to recheck CBC at next visit Patient taking iron supplement  5. [redacted] weeks gestation of pregnancy   Preterm labor symptoms and general obstetric precautions including but not limited to vaginal bleeding, contractions, leaking of fluid and fetal movement were reviewed in detail with the patient. I discussed the assessment and treatment plan with the patient. The patient was provided an opportunity to ask questions and all were answered. The patient agreed with the plan and demonstrated an understanding of the instructions. The patient was advised to call back or seek an in-person office evaluation/[facility]. Please refer to After Visit Summary for other counseling recommendations.   I  provided 5 minutes of face-to-face time during this encounter.    Future Appointments  Date Time Provider DDelphos 08/27/2021  3:15 PM WWilloughbyWDonalds NP [facility] [employer] [facility] [place]

## 2021-08-13 NOTE — BH Specialist Note (Deleted)
Integrated Behavioral Health via Telemedicine Visit  08/13/2021 Kathryn Livingston [id number]  Number of Integrated Behavioral Health visits: *** Session Start time: 3:15***  Session End time: 3:45*** Total time: {IBH Total Time:21014050}  Referring Provider: *** Patient/Family location: Home*** [facility] Provider location: [facility]  All persons participating in visit: Patient *** and Kathryn Livingston ***  Types of Service: {CHL AMB TYPE OF SERVICE:[phone]}  I connected with Kathryn Livingston and/or Kathryn Livingston's {family members:20773} via  Telephone or Video Enabled Telemedicine Application  (Video is Caregility application) and verified that I am speaking with the correct person using two identifiers. Discussed confidentiality: {YES/NO:21197}  I discussed the limitations of telemedicine and the availability of in person appointments.  Discussed there is a possibility of technology failure and discussed alternative modes of communication if that failure occurs.  I discussed that engaging in this telemedicine visit, they consent to the provision of behavioral healthcare and the services will be billed under their insurance.  Patient and/or legal guardian expressed understanding and consented to Telemedicine visit: {YES/NO:21197}  Presenting Concerns: Patient and/or family reports the following symptoms/concerns: *** Duration of problem: ***; Severity of problem: {Mild/Moderate/Severe:20260}  Patient and/or Family's Strengths/Protective Factors: {CHL AMB BH PROTECTIVE FACTORS:[phone]}  Goals Addressed: Patient will:  Reduce symptoms of: {IBH Symptoms:21014056}   Increase knowledge and/or ability of: {IBH Patient Tools:21014057}   Demonstrate ability to: {IBH Goals:21014053}  Progress towards Goals: {CHL AMB BH PROGRESS TOWARDS GOALS:[phone]}  Interventions: Interventions utilized:  {IBH  Interventions:21014054} Standardized Assessments completed: {IBH Screening Tools:21014051}  Patient and/or Family Response: ***  Assessment: Patient currently experiencing ***.   Patient may benefit from ***.  Plan: Follow up with behavioral health clinician on : *** Behavioral recommendations: *** Referral(s): {IBH Referrals:21014055}  I discussed the assessment and treatment plan with the patient and/or parent/guardian. They were provided an opportunity to ask questions and all were answered. They agreed with the plan and demonstrated an understanding of the instructions.   They were advised to call back or seek an in-person evaluation if the symptoms worsen or if the condition fails to improve as anticipated.  Kathryn Livingston Kathryn Mondesir, LCSW

## 2021-08-13 NOTE — Progress Notes (Signed)
Pt had to change her appt to virtual due to Mom having a Seizure, currently is at the hospital with her. Asked Pt to take BP when she gets back home & record it in My Chart. Pt had not questions nor concerns, advised will do Swabs at next appointment. Pt verbalized understanding, asked Pt to stay in My Chart.

## 2021-08-24 ENCOUNTER — Ambulatory Visit (INDEPENDENT_AMBULATORY_CARE_PROVIDER_SITE_OTHER): Payer: Medicaid Other | Admitting: Nurse Practitioner

## 2021-08-24 ENCOUNTER — Other Ambulatory Visit: Payer: Self-pay

## 2021-08-24 ENCOUNTER — Other Ambulatory Visit (HOSPITAL_COMMUNITY)
Admission: RE | Admit: 2021-08-24 | Discharge: 2021-08-24 | Disposition: A | Discharge status: Home / Self Care | Payer: Medicaid Other | Source: Ambulatory Visit | Attending: Nurse Practitioner | Admitting: Nurse Practitioner

## 2021-08-24 VITALS — BP 115/77 | HR 92 | Wt 204.1 lb

## 2021-08-24 DIAGNOSIS — Z5941 Food insecurity: Secondary | ICD-10-CM

## 2021-08-24 DIAGNOSIS — O0990 Supervision of high risk pregnancy, unspecified, unspecified trimester: Secondary | ICD-10-CM

## 2021-08-24 DIAGNOSIS — Z3A38 38 weeks gestation of pregnancy: Secondary | ICD-10-CM

## 2021-08-24 DIAGNOSIS — O99019 Anemia complicating pregnancy, unspecified trimester: Secondary | ICD-10-CM

## 2021-08-24 LAB — OB RESULTS CONSOLE GC/CHLAMYDIA: Gonorrhea: NEGATIVE

## 2021-08-24 NOTE — Progress Notes (Signed)
    Subjective:  Kathryn Livingston is a 35 y.o. W1U9323 at [redacted]w[redacted]d being seen today for ongoing prenatal care.  She is currently monitored for the following issues for this high-risk pregnancy and has Continuous tobacco abuse; Marijuana use; History of polyhydramnios; Severe major depression, single episode, with psychotic features (HCC); Supervision of high risk pregnancy, antepartum; Advanced maternal age in multigravida; Anemia during pregnancy; and Alpha thalassemia silent carrier on their problem list.  Patient reports no complaints.  Contractions: Irritability. Vag. Bleeding: None.  Movement: Present. Denies leaking of fluid.   The following portions of the patient's history were reviewed and updated as appropriate: allergies, current medications, past family history, past medical history, past social history, past surgical history and problem list. Problem list updated.  Objective:   Vitals:   08/24/21 1338  BP: 115/77  Pulse: 92  Weight: 204 lb 1.6 oz (92.6 kg)    Fetal Status: Fetal Heart Rate (bpm): 145 Fundal Height: 36 cm Movement: Present  Presentation: Vertex  General:  Alert, oriented and cooperative. Patient is in no acute distress.  Skin: Skin is warm and dry. No rash noted.   Cardiovascular: Normal heart rate noted  Respiratory: Normal respiratory effort, no problems with respiration noted  Abdomen: Soft, gravid, appropriate for gestational age. Pain/Pressure: Present     Pelvic:  Cervical exam performed Dilation: Fingertip Effacement (%): 50 Station: -3  Extremities: Normal range of motion.  Edema: Trace  Mental Status: Normal mood and affect. Normal behavior. Normal judgment and thought content.   Urinalysis:      Assessment and Plan:  Pregnancy: F5D3220 at [redacted]w[redacted]d  1. Supervision of high risk pregnancy, antepartum Cervix not as dialated as she wanted - cervix was posterior Will return in one week and discuss induction if still not having contractions  - Culture,  beta strep (group b only) - Cervicovaginal ancillary only( [place]) - CBC  2. Food insecurity Bag of food offered  - market closed today  - AMBULATORY REFERRAL TO [facility]  3. Anemia during pregnancy Hematologist advised stopping her iron supplement Will check CBC today - last checked mid October Hematologist recommends if HGB >8, no infusion needed  4. [redacted] weeks gestation of pregnancy   Term labor symptoms and general obstetric precautions including but not limited to vaginal bleeding, contractions, leaking of fluid and fetal movement were reviewed in detail with the patient. Please refer to After Visit Summary for other counseling recommendations.  Return in about 1 week (around 08/31/2021) for in person ROB.  Nolene Bernheim, RN, MSN, NP-BC Nurse Practitioner, [facility] for [employer], [facility] 08/24/2021 2:03 PM

## 2021-08-25 LAB — CBC
Hematocrit: 29.4 % — ABNORMAL LOW (ref 34.0–46.6)
Hemoglobin: 10 g/dL — ABNORMAL LOW (ref 11.1–15.9)
MCH: 30.5 pg (ref 26.6–33.0)
MCHC: 34 g/dL (ref 31.5–35.7)
MCV: 90 fL (ref 79–97)
Platelets: 300 10*3/uL (ref 150–450)
RBC: 3.28 x10E6/uL — ABNORMAL LOW (ref 3.77–5.28)
RDW: 11.6 % — ABNORMAL LOW (ref 11.7–15.4)
WBC: 7.3 10*3/uL (ref 3.4–10.8)

## 2021-08-25 LAB — CERVICOVAGINAL ANCILLARY ONLY
Chlamydia: NEGATIVE
Comment: NEGATIVE
Comment: NORMAL
Neisseria Gonorrhea: NEGATIVE

## 2021-08-27 LAB — CULTURE, BETA STREP (GROUP B ONLY): Strep Gp B Culture: POSITIVE — AB

## 2021-08-28 ENCOUNTER — Encounter: Payer: Self-pay | Admitting: Nurse Practitioner

## 2021-08-28 DIAGNOSIS — O99820 Streptococcus B carrier state complicating pregnancy: Secondary | ICD-10-CM | POA: Insufficient documentation

## 2021-08-30 ENCOUNTER — Inpatient Hospital Stay (HOSPITAL_COMMUNITY): Payer: Medicaid Other | Admitting: Anesthesiology

## 2021-08-30 ENCOUNTER — Encounter (HOSPITAL_COMMUNITY): Payer: Self-pay | Admitting: Obstetrics and Gynecology

## 2021-08-30 ENCOUNTER — Inpatient Hospital Stay (HOSPITAL_COMMUNITY)
Admission: AD | Admit: 2021-08-30 | Discharge: 2021-09-01 | DRG: 807 | Disposition: A | Discharge status: Home / Self Care | Payer: Medicaid Other | Attending: Obstetrics and Gynecology | Admitting: Obstetrics and Gynecology

## 2021-08-30 ENCOUNTER — Other Ambulatory Visit: Payer: Self-pay

## 2021-08-30 DIAGNOSIS — Z3A39 39 weeks gestation of pregnancy: Secondary | ICD-10-CM

## 2021-08-30 DIAGNOSIS — O135 Gestational [pregnancy-induced] hypertension without significant proteinuria, complicating the puerperium: Secondary | ICD-10-CM

## 2021-08-30 DIAGNOSIS — F17210 Nicotine dependence, cigarettes, uncomplicated: Secondary | ICD-10-CM | POA: Diagnosis present

## 2021-08-30 DIAGNOSIS — O165 Unspecified maternal hypertension, complicating the puerperium: Secondary | ICD-10-CM | POA: Diagnosis not present

## 2021-08-30 DIAGNOSIS — O99820 Streptococcus B carrier state complicating pregnancy: Secondary | ICD-10-CM

## 2021-08-30 DIAGNOSIS — O0990 Supervision of high risk pregnancy, unspecified, unspecified trimester: Secondary | ICD-10-CM

## 2021-08-30 DIAGNOSIS — D563 Thalassemia minor: Secondary | ICD-10-CM | POA: Diagnosis present

## 2021-08-30 DIAGNOSIS — O99019 Anemia complicating pregnancy, unspecified trimester: Secondary | ICD-10-CM | POA: Diagnosis present

## 2021-08-30 DIAGNOSIS — O9902 Anemia complicating childbirth: Secondary | ICD-10-CM | POA: Diagnosis present

## 2021-08-30 DIAGNOSIS — Z20822 Contact with and (suspected) exposure to covid-19: Secondary | ICD-10-CM | POA: Diagnosis present

## 2021-08-30 DIAGNOSIS — O99824 Streptococcus B carrier state complicating childbirth: Secondary | ICD-10-CM | POA: Diagnosis present

## 2021-08-30 DIAGNOSIS — O99334 Smoking (tobacco) complicating childbirth: Secondary | ICD-10-CM | POA: Diagnosis present

## 2021-08-30 LAB — CBC
HCT: 30.6 % — ABNORMAL LOW (ref 36.0–46.0)
Hemoglobin: 10.3 g/dL — ABNORMAL LOW (ref 12.0–15.0)
MCH: 31.3 pg (ref 26.0–34.0)
MCHC: 33.7 g/dL (ref 30.0–36.0)
MCV: 93 fL (ref 80.0–100.0)
Platelets: 289 10*3/uL (ref 150–400)
RBC: 3.29 MIL/uL — ABNORMAL LOW (ref 3.87–5.11)
RDW: 12.8 % (ref 11.5–15.5)
WBC: 10.3 10*3/uL (ref 4.0–10.5)
nRBC: 0 % (ref 0.0–0.2)

## 2021-08-30 LAB — RESP PANEL BY RT-PCR (FLU A&B, COVID) ARPGX2
Influenza A by PCR: NEGATIVE
Influenza B by PCR: NEGATIVE
SARS Coronavirus 2 by RT PCR: NEGATIVE

## 2021-08-30 MED ORDER — BENZOCAINE-MENTHOL 20-0.5 % EX AERO: 1.0000 "application " | INHALATION_SPRAY | CUTANEOUS | Status: DC | PRN

## 2021-08-30 MED ORDER — TETANUS-DIPHTH-ACELL PERTUSSIS 5-2.5-18.5 LF-MCG/0.5 IM SUSY: 0.5000 mL | PREFILLED_SYRINGE | Freq: Once | INTRAMUSCULAR | Status: DC

## 2021-08-30 MED ORDER — SODIUM CHLORIDE 0.9 % IV SOLN
5.0000 10*6.[IU] | Freq: Once | INTRAVENOUS | Status: AC
  Administered 2021-08-30: 18:00:00 5 10*6.[IU] via INTRAVENOUS
  Filled 2021-08-30: qty 5

## 2021-08-30 MED ORDER — LIDOCAINE HCL (PF) 1 % IJ SOLN: 30.0000 mL | INTRAMUSCULAR | Status: DC | PRN

## 2021-08-30 MED ORDER — DIBUCAINE (PERIANAL) 1 % EX OINT: 1.0000 "application " | TOPICAL_OINTMENT | CUTANEOUS | Status: DC | PRN

## 2021-08-30 MED ORDER — OXYCODONE-ACETAMINOPHEN 5-325 MG PO TABS: 2.0000 | ORAL_TABLET | ORAL | Status: DC | PRN

## 2021-08-30 MED ORDER — SOD CITRATE-CITRIC ACID 500-334 MG/5ML PO SOLN
30.0000 mL | ORAL | Status: DC | PRN
Start: 2021-08-30 — End: 2021-08-30

## 2021-08-30 MED ORDER — WITCH HAZEL-GLYCERIN EX PADS: 1.0000 "application " | MEDICATED_PAD | CUTANEOUS | Status: DC | PRN

## 2021-08-30 MED ORDER — PHENYLEPHRINE 40 MCG/ML (10ML) SYRINGE FOR IV PUSH (FOR BLOOD PRESSURE SUPPORT): 80.0000 ug | PREFILLED_SYRINGE | INTRAVENOUS | Status: DC | PRN

## 2021-08-30 MED ORDER — ONDANSETRON HCL 4 MG PO TABS: 4.0000 mg | ORAL_TABLET | ORAL | Status: DC | PRN

## 2021-08-30 MED ORDER — PRENATAL MULTIVITAMIN CH
1.0000 | ORAL_TABLET | Freq: Every day | ORAL | Status: DC
  Administered 2021-08-31 – 2021-09-01 (×2): 1 via ORAL
  Filled 2021-08-30 (×2): qty 1

## 2021-08-30 MED ORDER — FENTANYL CITRATE (PF) 100 MCG/2ML IJ SOLN
50.0000 ug | INTRAMUSCULAR | Status: DC | PRN
Start: 2021-08-30 — End: 2021-08-30

## 2021-08-30 MED ORDER — LACTATED RINGERS IV SOLN: 500.0000 mL | Freq: Once | INTRAVENOUS | Status: DC

## 2021-08-30 MED ORDER — OXYTOCIN BOLUS FROM INFUSION
333.0000 mL | Freq: Once | INTRAVENOUS | Status: AC
  Administered 2021-08-30: 20:00:00 333 mL via INTRAVENOUS

## 2021-08-30 MED ORDER — LACTATED RINGERS IV SOLN
500.0000 mL | Freq: Once | INTRAVENOUS | Status: AC
  Administered 2021-08-30: 18:00:00 500 mL via INTRAVENOUS

## 2021-08-30 MED ORDER — ONDANSETRON HCL 4 MG/2ML IJ SOLN: 4.0000 mg | INTRAMUSCULAR | Status: DC | PRN

## 2021-08-30 MED ORDER — MEASLES, MUMPS & RUBELLA VAC IJ SOLR: 0.5000 mL | Freq: Once | INTRAMUSCULAR | Status: DC

## 2021-08-30 MED ORDER — SENNOSIDES-DOCUSATE SODIUM 8.6-50 MG PO TABS
2.0000 | ORAL_TABLET | Freq: Every day | ORAL | Status: DC
  Administered 2021-08-31: 2 via ORAL
  Filled 2021-08-30 (×2): qty 2

## 2021-08-30 MED ORDER — MEDROXYPROGESTERONE ACETATE 150 MG/ML IM SUSP
150.0000 mg | INTRAMUSCULAR | Status: AC | PRN
  Administered 2021-09-01: 150 mg via INTRAMUSCULAR
  Filled 2021-08-30: qty 1

## 2021-08-30 MED ORDER — ACETAMINOPHEN 325 MG PO TABS
650.0000 mg | ORAL_TABLET | ORAL | Status: DC | PRN
  Administered 2021-08-31 – 2021-09-01 (×5): 650 mg via ORAL
  Filled 2021-08-30 (×5): qty 2

## 2021-08-30 MED ORDER — EPHEDRINE 5 MG/ML INJ: 10.0000 mg | INTRAVENOUS | Status: DC | PRN

## 2021-08-30 MED ORDER — OXYTOCIN-SODIUM CHLORIDE 30-0.9 UT/500ML-% IV SOLN
2.5000 [IU]/h | INTRAVENOUS | Status: DC
  Filled 2021-08-30: qty 500

## 2021-08-30 MED ORDER — LIDOCAINE HCL (PF) 1 % IJ SOLN
INTRAMUSCULAR | Status: DC | PRN
  Administered 2021-08-30 (×2): 5 mL via EPIDURAL

## 2021-08-30 MED ORDER — PENICILLIN G POT IN DEXTROSE 60000 UNIT/ML IV SOLN
3.0000 10*6.[IU] | INTRAVENOUS | Status: DC
  Filled 2021-08-30 (×3): qty 50

## 2021-08-30 MED ORDER — SIMETHICONE 80 MG PO CHEW: 80.0000 mg | CHEWABLE_TABLET | ORAL | Status: DC | PRN

## 2021-08-30 MED ORDER — DIPHENHYDRAMINE HCL 50 MG/ML IJ SOLN: 12.5000 mg | INTRAMUSCULAR | Status: DC | PRN

## 2021-08-30 MED ORDER — ONDANSETRON HCL 4 MG/2ML IJ SOLN
4.0000 mg | Freq: Four times a day (QID) | INTRAMUSCULAR | Status: DC | PRN
Start: 2021-08-30 — End: 2021-08-30
  Administered 2021-08-30: 20:00:00 4 mg via INTRAVENOUS
  Filled 2021-08-30: qty 2

## 2021-08-30 MED ORDER — IBUPROFEN 600 MG PO TABS
600.0000 mg | ORAL_TABLET | Freq: Four times a day (QID) | ORAL | Status: DC
  Administered 2021-08-30 – 2021-09-01 (×7): 600 mg via ORAL
  Filled 2021-08-30 (×8): qty 1

## 2021-08-30 MED ORDER — FLEET ENEMA 7-19 GM/118ML RE ENEM: 1.0000 | ENEMA | RECTAL | Status: DC | PRN

## 2021-08-30 MED ORDER — OXYCODONE-ACETAMINOPHEN 5-325 MG PO TABS: 1.0000 | ORAL_TABLET | ORAL | Status: DC | PRN

## 2021-08-30 MED ORDER — LACTATED RINGERS IV SOLN: INTRAVENOUS | Status: DC

## 2021-08-30 MED ORDER — COCONUT OIL OIL: 1.0000 "application " | TOPICAL_OIL | Status: DC | PRN

## 2021-08-30 MED ORDER — LACTATED RINGERS IV SOLN: 500.0000 mL | INTRAVENOUS | Status: DC | PRN

## 2021-08-30 MED ORDER — FENTANYL-BUPIVACAINE-NACL 0.5-0.125-0.9 MG/250ML-% EP SOLN
12.0000 mL/h | EPIDURAL | Status: DC | PRN
  Administered 2021-08-30: 19:00:00 12 mL/h via EPIDURAL
  Filled 2021-08-30: qty 250

## 2021-08-30 MED ORDER — DIPHENHYDRAMINE HCL 25 MG PO CAPS: 25.0000 mg | ORAL_CAPSULE | Freq: Four times a day (QID) | ORAL | Status: DC | PRN

## 2021-08-30 MED ORDER — ACETAMINOPHEN 325 MG PO TABS: 650.0000 mg | ORAL_TABLET | ORAL | Status: DC | PRN

## 2021-08-30 NOTE — Progress Notes (Signed)
Labor Progress Note ANDREANA KLINGERMAN is a 35 y.o. [phone] at [redacted]w[redacted]d presented for early labor/NRFHT in MAU  S:  Patient comfortable with epidural. States "it's almost time to have this baby."  O:  BP (!) 145/72   Pulse 89   Temp 97.9 F (36.6 C) (Axillary)   Resp 18   Ht 5\' 6"  (1.676 m)   Wt 93 kg   LMP 11/28/2020 (Exact Date)   SpO2 100%   BMI 33.09 kg/m   Fetal Tracing:  Baseline: 150 Variability: moderate Accels: 15x15 Decels: variable  Toco: 3-5   CVE: Dilation: 7 Effacement (%): 100 Station: -3 Presentation: Vertex Exam by:: [email or web address], CNM   A&P: 35 y.o. 30 [redacted]w[redacted]d SOL, FHR now improved since admission to labor and delivery  #Labor: Progressing well. Discussed AROM after GBS prophylaxis and patient wants to wait for significant other to get here first. Expectant management for now #Pain: epidural #FWB: Cat 1 #GBS positive  [redacted]w[redacted]d, CNM 7:09 PM

## 2021-08-30 NOTE — MAU Note (Signed)
Patient arrived via ems complaining of contractions that started at 12 noon. Patient reports + fm.  No leakage of fluid. She stated that she does see mucous when she wipes. Patient stated that she gets care at center for [facility]. Efm commenced.

## 2021-08-30 NOTE — Lactation Note (Signed)
This note was copied from a baby's chart. Lactation Consultation Note  Patient Name: Kathryn Livingston [id number] Date: 08/30/2021 Reason for consult: L&D Initial assessment;Mother's request;Difficult latch;1st time breastfeeding;Term;Breastfeeding assistance Age:37 hours  LC attempted latch, infant did a few suck swallows coming on and off breast.  Mom feeding plan breast and formula.  Mom to receive further LC support on the floor.   Maternal Data    Feeding Mother's Current Feeding Choice: Breast Milk and Formula  LATCH Score Latch: Repeated attempts needed to sustain latch, nipple held in mouth throughout feeding, stimulation needed to elicit sucking reflex.  Audible Swallowing: A few with stimulation  Type of Nipple: Everted at rest and after stimulation  Comfort (Breast/Nipple): Soft / non-tender  Hold (Positioning): Assistance needed to correctly position infant at breast and maintain latch.  LATCH Score: 7   Lactation Tools Discussed/Used    Interventions Interventions: Breast feeding basics reviewed;Assisted with latch;Skin to skin;Breast compression;Adjust position;Expressed milk;Education  Discharge WIC Program: Yes  Consult Status Consult Status: Follow-up from L&D Date: 08/31/21 Follow-up type: In-patient    Petrona Wyeth  Nicholson-Springer 08/30/2021, 9:08 PM

## 2021-08-30 NOTE — Anesthesia Procedure Notes (Signed)
Epidural Patient location during procedure: OB Start time: 08/30/2021 6:41 PM End time: 08/30/2021 6:54 PM  Staffing Anesthesiologist: Achille Rich, MD Performed: anesthesiologist   Preanesthetic Checklist Completed: patient identified, IV checked, site marked, risks and benefits discussed, monitors and equipment checked, pre-op evaluation and timeout performed  Epidural Patient position: sitting Prep: DuraPrep Patient monitoring: heart rate, cardiac monitor, continuous pulse ox and blood pressure Approach: midline Location: L2-L3 Injection technique: LOR saline  Needle:  Needle type: Tuohy  Needle gauge: 17 G Needle length: 9 cm Needle insertion depth: 6 cm Catheter type: closed end flexible Catheter size: 19 Gauge Catheter at skin depth: 12 cm Test dose: negative and Other  Assessment Events: blood not aspirated, injection not painful, no injection resistance and negative IV test  Additional Notes Informed consent obtained prior to proceeding including risk of failure, 1% risk of PDPH, risk of minor discomfort and bruising.  Discussed rare but serious complications including epidural abscess, permanent nerve injury, epidural hematoma.  Discussed alternatives to epidural analgesia and patient desires to proceed.  Timeout performed pre-procedure verifying patient name, procedure, and platelet count.  Patient tolerated procedure well. Reason for block:procedure for pain

## 2021-08-30 NOTE — H&P (Signed)
OBSTETRIC ADMISSION HISTORY AND PHYSICAL  Kathryn Livingston is a 35 y.o. female [phone] with IUP at [redacted]w[redacted]d by LMP presenting for early labor and NRFHT in MAU. She reports +FMs, No LOF, no VB, no blurry vision, headaches or peripheral edema, and RUQ pain.  She plans on breast and bottle feeding. She request BTL for birth control. She received her prenatal care at [facility]   Dating: By LMP --->  Estimated Date of Delivery: 09/04/21  Sono:    @[redacted]w[redacted]d , CWD, normal anatomy, cephalic presentation, 2590g, EFW  Nursing Staff Provider  Office Location [facility] Dating  LMP, cw 22 wk ultrasound  Language  English Anatomy 82%  Normal anatomy at 22 wkkes  Flu Vaccine  Declined 02/19/21 Genetic/Carrier Screen  NIPS:   Low risk female  AFP:    Horizon: Alpha thal carrier   TDaP Vaccine   06/12/21 Hgb A1C or  GTT Early  Third trimester 06/12/21  COVID Vaccine Pfizer x1 (cannot recall date)   LAB RESULTS   Rhogam  n/a Blood Type B/Positive/-- (05/26 1448)   Baby Feeding Plan Both Antibody  Negative  Contraception BTL Rubella <20.0 (05/26 1448)  Circumcision Yes- if boy RPR Non Reactive (05/26 1448)   Pediatrician  [facility] HBsAg Negative (05/26 1448)   Support Person 02-19[birth year]1978 (FOB/partner) HCVAb Negative  Prenatal Classes  HIV Non Reactive (05/04 1053)     BTL Consent Needs GBS   (For PCN allergy, check sensitivities)   VBAC Consent NA Pap ASCUS; HPV neg +trich       BP Cuff Ordered 02/19/21 Waterbirth  [ ]  Class [ ]  Consent [ ]  CNM visit  PHQ9 & GAD7 [x]  new OB [ X] 28 weeks  [  ] 36 weeks Induction  [ ]  Orders Entered [ ] Foley Y/N    Prenatal History/Complications: AMA, anemia  Past Medical History: Past Medical History:  Diagnosis Date   Alcohol abuse 04/11/2016   Bipolar 1 disorder (HCC)    Breast abscess    Depression    doing ok   Headache    Low maternal weight gain    Obesity    Schizophrenia (HCC)     Past Surgical History: Past Surgical History:  Procedure Laterality  Date   DILATION AND CURETTAGE OF UTERUS      Obstetrical History: OB History     Gravida  6   Para  4   Term  4   Preterm  0   AB  1   Living  4      SAB  1   IAB  0   Ectopic  0   Multiple  0   Live Births  4           Social History Social History   Socioeconomic History   Marital status: Single    Spouse name: Not on file   Number of children: Not on file   Years of education: Not on file   Highest education level: Not on file  Occupational History   Not on file  Tobacco Use   Smoking status: Every Day    Packs/day: 0.25    Years: 15.00    Pack years: 3.75    Types: Cigarettes   Smokeless tobacco: Never  Vaping Use   Vaping Use: Never used  Substance and Sexual Activity   Alcohol use: Not Currently    Comment: 1 beer   Drug use: Not Currently    Frequency: 7.0 times  per week    Types: Marijuana    Comment: last used marijuana 07/19/2021 as of 07/20/2021   Sexual activity: Not Currently    Birth control/protection: None  Other Topics Concern   Not on file  Social History Narrative   ** Merged History Encounter **       Social Determinants of Health   Financial Resource Strain: Not on file  Food Insecurity: Food Insecurity Present   Worried About Running Out of Food in the Last Year: Sometimes true   Ran Out of Food in the Last Year: Sometimes true  Transportation Needs: No Transportation Needs   Lack of Transportation (Medical): No   Lack of Transportation (Non-Medical): No  Physical Activity: Not on file  Stress: Not on file  Social Connections: Not on file    Family History: Family History  Problem Relation Age of Onset   Hypertension Mother    Stroke Mother    Diabetes Mother    Hypertension Father    Cancer Paternal Aunt    Diabetes Maternal Aunt     Allergies: Allergies  Allergen Reactions   Nicoderm [Nicotine]     Medications Prior to Admission  Medication Sig Dispense Refill Last Dose   Prenatal Vit-Fe  Fumarate-FA (PREPLUS) 27-1 MG TABS Take 1 tablet by mouth daily. 30 tablet 13 Past Week   cyclobenzaprine (FLEXERIL) 10 MG tablet Take 1 tablet (10 mg total) by mouth 3 (three) times daily as needed for muscle spasms. (Patient not taking: Reported on 07/29/2021) 30 tablet 2    docusate sodium (COLACE) 100 MG capsule Take 1 capsule (100 mg total) by mouth 2 (two) times daily. (Patient not taking: Reported on 07/29/2021) 30 capsule 0    ferrous sulfate (FERROUSUL) 325 (65 FE) MG tablet Take 1 tablet (325 mg total) by mouth 2 (two) times daily. (Patient not taking: Reported on 07/29/2021) 60 tablet 1    nicotine polacrilex (NICORETTE) 2 MG gum Take 1 each (2 mg total) by mouth as needed for smoking cessation. (Patient not taking: Reported on 07/29/2021) 100 tablet 1    ondansetron (ZOFRAN ODT) 4 MG disintegrating tablet Take 1 tablet (4 mg total) by mouth every 6 (six) hours as needed for nausea. (Patient not taking: Reported on 07/29/2021) 20 tablet 0      Review of Systems   All systems reviewed and negative except as stated in HPI  Blood pressure 129/79, pulse 84, temperature 97.9 F (36.6 C), temperature source Oral, resp. rate 17, last menstrual period 11/28/2020, SpO2 100 %, unknown if currently breastfeeding. General appearance: alert, cooperative, and no distress Lungs: clear to auscultation bilaterally Heart: regular rate and rhythm Abdomen: soft, non-tender; bowel sounds normal Pelvic: n/a Extremities: Homans sign is negative, no sign of DVT DTR's +2 Presentation: cephalic Fetal monitoringBaseline: 150 bpm, Variability: Good {> 6 bpm), Accelerations: Reactive, and Decelerations: Variable:   Uterine activityFrequency: Every 10-15 minutes Dilation: 4 Effacement (%): 60 Station: -2 Exam by:: Doreatha Massed, RNC   Prenatal labs: ABO, Rh: B/Positive/-- (05/26 1448) Antibody:   Rubella: <20.0 (05/26 1448) RPR: Non Reactive (09/16 0847)  HBsAg: Negative (05/26 1448)  HIV: Non Reactive  (09/16 0847)  GBS: Positive/-- (11/28 1401)   Prenatal Transfer Tool  Maternal Diabetes: No Genetic Screening: Normal Maternal Ultrasounds/Referrals: Normal Fetal Ultrasounds or other Referrals:  Referred to Materal Fetal Medicine  Maternal Substance Abuse:  No Significant Maternal Medications:  None Significant Maternal Lab Results: Group B Strep positive  No results found for this or  any previous visit (from the past 24 hour(s)).  Patient Active Problem List   Diagnosis Date Noted   Group B Streptococcus carrier, +RV culture, currently pregnant 08/28/2021   Alpha thalassemia silent carrier 04/16/2021   Anemia during pregnancy 03/23/2021   Supervision of high risk pregnancy, antepartum 02/19/2021   Advanced maternal age in multigravida 02/19/2021   Severe major depression, single episode, with psychotic features (HCC) 04/11/2016   History of polyhydramnios 03/17/2015   Continuous tobacco abuse 02/26/2015   Marijuana use 02/26/2015    Assessment/Plan:  Kathryn Livingston is a 35 y.o. [id number] at [redacted]w[redacted]d here for early labor/NRFHT in MAU  #Labor: Expectant management for now. Patient planning epidural ASAP #Pain: Epidural  #FWB: Cat 2, minimal to moderate variability with variable decelerations. IV fluid bolus started #ID:  GBS pos, PCN #MOF: both #MOC: BTL, papers 10/3 #Circ:  yes  Rolm Bookbinder, CNM  08/30/2021, 5:05 PM

## 2021-08-30 NOTE — Anesthesia Preprocedure Evaluation (Signed)
Anesthesia Evaluation  Patient identified by MRN, date of birth, ID band Patient awake    Reviewed: Allergy & Precautions, H&P , NPO status , Patient's Chart, lab work & pertinent test results  Airway Mallampati: II   Neck ROM: full    Dental   Pulmonary Current Smoker,    breath sounds clear to auscultation       Cardiovascular negative cardio ROS   Rhythm:regular Rate:Normal     Neuro/Psych  Headaches, PSYCHIATRIC DISORDERS Depression Bipolar Disorder Schizophrenia    GI/Hepatic   Endo/Other  Morbid obesity  Renal/GU      Musculoskeletal   Abdominal   Peds  Hematology  (+) Blood dyscrasia, anemia ,   Anesthesia Other Findings   Reproductive/Obstetrics (+) Pregnancy                             Anesthesia Physical Anesthesia Plan  ASA: 2  Anesthesia Plan: Epidural   Post-op Pain Management:    Induction: Intravenous  PONV Risk Score and Plan: 1 and Treatment may vary due to age or medical condition  Airway Management Planned: Natural Airway  Additional Equipment:   Intra-op Plan:   Post-operative Plan:   Informed Consent: I have reviewed the patients History and Physical, chart, labs and discussed the procedure including the risks, benefits and alternatives for the proposed anesthesia with the patient or authorized representative who has indicated his/her understanding and acceptance.     Dental advisory given  Plan Discussed with: Anesthesiologist  Anesthesia Plan Comments:         Anesthesia Quick Evaluation

## 2021-08-31 ENCOUNTER — Encounter: Payer: Medicaid Other | Admitting: Obstetrics and Gynecology

## 2021-08-31 LAB — RPR: RPR Ser Ql: NONREACTIVE

## 2021-08-31 NOTE — Anesthesia Postprocedure Evaluation (Signed)
Anesthesia Post Note  Patient: ALAJIAH DUTKIEWICZ  Procedure(s) Performed: AN AD HOC LABOR EPIDURAL     Patient location during evaluation: Mother Baby Anesthesia Type: Epidural Level of consciousness: awake and alert and oriented Pain management: satisfactory to patient Vital Signs Assessment: post-procedure vital signs reviewed and stable Respiratory status: respiratory function stable Cardiovascular status: stable Postop Assessment: no headache, no backache, epidural receding, patient able to bend at knees, no signs of nausea or vomiting, adequate PO intake and able to ambulate Anesthetic complications: no   No notable events documented.  Last Vitals:  Vitals:   08/31/21 0800 08/31/21 1200  BP: 119/89 122/74  Pulse: 67 76  Resp: 18 20  Temp: (!) 36.4 C 36.9 C  SpO2: 100% 99%    Last Pain:  Vitals:   08/31/21 1226  TempSrc:   PainSc: 8    Pain Goal: Patients Stated Pain Goal: 0 (08/30/21 1613)                 Karleen Dolphin

## 2021-08-31 NOTE — Discharge Summary (Signed)
Postpartum Discharge Summary     Patient Name: Kathryn Livingston DOB: 1986/07/15 MRN: [id number]  Date of admission: 08/30/2021 Delivery date:08/30/2021  Delivering provider: Renard Matter  Date of discharge: 09/01/2021  Admitting diagnosis: Labor and delivery, indication for care [O75.9] Intrauterine pregnancy: [redacted]w[redacted]d    Secondary diagnosis:  Active Problems:   Supervision of high risk pregnancy, antepartum   Anemia during pregnancy   Alpha thalassemia silent carrier   Group B Streptococcus carrier, +RV culture, currently pregnant   Labor and delivery, indication for care   Vaginal delivery   Postpartum hypertension  Additional problems: None    Discharge diagnosis: Term Pregnancy Delivered and Postpartum Hypertension                                               Post partum procedures: none Augmentation: AROM Complications: None  Hospital course: Onset of Labor With Vaginal Delivery      35y.o. yo [id number] 340w2das admitted in Latent Labor and nrFHT on 08/30/2021. Patient had an uncomplicated labor course as follows:  Membrane Rupture Time/Date: 8:21 PM ,08/30/2021   Delivery Method:Vaginal, Spontaneous  Episiotomy: None  Lacerations:  None  Patient had a postpartum course remarkable for having some mild range BP elevations requiring ProcardiaXL 3080mt discharge.  She is ambulating, tolerating a regular diet, passing flatus, and urinating well. Patient is discharged home in stable condition on 09/01/21 with Depo requested by pt prior to d/c.  Newborn Data: Birth date:08/30/2021  Birth time:8:23 PM  Gender:Female  Living status:Living  Apgars:5 ,7  Weight:2580 g   Magnesium Sulfate received: No BMZ received: No Rhophylac:N/A MMR:No, offered postpartum T-DaP:Given prenatally Flu: Declined Transfusion:No  Physical exam  Vitals:   08/31/21 0800 08/31/21 1200 08/31/21 2130 09/01/21 0542  BP: 119/89 122/74 126/85 102/63  Pulse: 67 76 79 72  Resp: _0 Temp:  (!) 97.5 F (36.4 C) 98.4 F (36.9 C) 98.7 F (37.1 C) 98.2 F (36.8 C)  TempSrc: Oral Oral Oral Oral  SpO2: 100% 99% 98%   Weight:      Height:       General: alert and cooperative Lochia: appropriate Uterine Fundus: firm Incision: N/A DVT Evaluation: No evidence of DVT seen on physical exam. Labs: Lab Results  Component Value Date   WBC 10.3 08/30/2021   HGB 10.3 (L) 08/30/2021   HCT 30.6 (L) 08/30/2021   MCV 93.0 08/30/2021   PLT 289 08/30/2021   CMP Latest Ref Rng & Units 07/09/2021  Glucose 70 - 99 mg/dL 108(H)  BUN 6 - 20 mg/dL <5(L)  Creatinine 0.44 - 1.00 mg/dL 0.50  Sodium 135 - 145 mmol/L 135  Potassium 3.5 - 5.1 mmol/L 3.5  Chloride 98 - 111 mmol/L 106  CO2 22 - 32 mmol/L 22  Calcium 8.9 - 10.3 mg/dL 8.5(L)  Total Protein 6.5 - 8.1 g/dL 6.5  Total Bilirubin 0.3 - 1.2 mg/dL 0.4  Alkaline Phos 38 - 126 U/L 59  AST 15 - 41 U/L 16  ALT 0 - 44 U/L 12   Edinburgh Score: No flowsheet data found.   After visit meds:  Allergies as of 09/01/2021       Reactions   Nicoderm [nicotine]         Medication List     STOP taking these medications  cyclobenzaprine 10 MG tablet Commonly known as: FLEXERIL   docusate sodium 100 MG capsule Commonly known as: Colace   nicotine polacrilex 2 MG gum Commonly known as: NICORETTE   ondansetron 4 MG disintegrating tablet Commonly known as: Zofran ODT       TAKE these medications    ferrous sulfate 325 (65 FE) MG tablet Commonly known as: FerrouSul Take 1 tablet (325 mg total) by mouth 2 (two) times daily.   ibuprofen 600 MG tablet Commonly known as: ADVIL Take 1 tablet (600 mg total) by mouth every 6 (six) hours as needed.   NIFEdipine 30 MG 24 hr tablet Commonly known as: ADALAT CC Take 1 tablet (30 mg total) by mouth daily.   PrePLUS 27-1 MG Tabs Take 1 tablet by mouth daily.         Discharge home in stable condition Infant Feeding: Bottle and Breast Infant Disposition:home with  mother Discharge instruction: per After Visit Summary and Postpartum booklet. Activity: Advance as tolerated. Pelvic rest for 6 weeks.  Diet: routine diet Future Appointments: Future Appointments  Date Time Provider [place]  09/07/2021  1:30 PM [place] [facility]  09/17/2021  3:15 PM [place] [facility]  10/12/2021 10:35 AM Aletha Halim, MD [facility]   Follow up Visit: Message sent to [facility] by Dr. Cy Blamer on 08/31/2021  Please schedule this patient for a In person postpartum visit in 6 weeks with the following provider: Any provider. Additional Postpartum F/U: 1 week BP check   Low risk pregnancy complicated by:  elevated BP post partum Delivery mode:  Vaginal, Spontaneous  Anticipated Birth Control:  PP Depo given   09/01/2021 Myrtis Ser, CNM 11:26 AM

## 2021-08-31 NOTE — Lactation Note (Signed)
This note was copied from a baby's chart. Lactation Consultation Note  Patient Name: Kathryn Livingston [id number] Date: 08/31/2021 Reason for consult: Follow-up assessment;Mother's request;Difficult latch;Term;Breastfeeding assistance Age:37 hours  LC went in to assist with breastfeeding. RN mentioned infant appeared jittery. Mom concerned infant leaking formula with slow flow nipple. LC provided extra slow flow, infant took 30 ml with some emesis and leakage.  RN alerted provider about infant jittery status.  LC reviewed feeding guidelines for LPTI to reduce calorie loss given infant less than 6 lbs.  Plan 1. To feed based on cues 8-12x 24hr period. Mom to offer breasts and look for signs of milk transfer.  2. Mom to supplement with EBM first followed by formula. Mom to follow LPTI guidelines and offer more volume for supplementation if infant not able to latch. 10-20 ml offering more if not latching.  3. DEBP q 3hrs for 15 min  4. I and O sheet provided.  Mom to call for latch assistance with next feeding.   Maternal Data Has patient been taught Hand Expression?: Yes  Feeding Mother's Current Feeding Choice: Breast Milk and Formula Nipple Type: Extra Slow Flow  LATCH Score                    Lactation Tools Discussed/Used Tools: Pump;Flanges Flange Size: 24 (RN set up pump parts) Breast pump type: Double-Electric Breast Pump Pump Education: Setup, frequency, and cleaning;Milk Storage Reason for Pumping: increase stimulation Pumping frequency: every 3 hrs for 15 min  Interventions Interventions: Breast feeding basics reviewed;Position options;Expressed milk;Hand pump;DEBP;Education;Pace feeding  Discharge Pump: Manual WIC Program: Yes  Consult Status Consult Status: Follow-up Date: 09/01/21 Follow-up type: In-patient    Melbert Botelho  Nicholson-Springer 08/31/2021, 9:39 PM

## 2021-08-31 NOTE — Progress Notes (Signed)
Post Partum Day 1 Subjective: no complaints, up ad lib, voiding, and tolerating PO  Objective: Blood pressure 134/85, pulse 90, temperature 98 F (36.7 C), temperature source Oral, resp. rate 16, height 5\' 6"  (1.676 m), weight 93 kg, last menstrual period 11/28/2020, SpO2 100 %, unknown if currently breastfeeding.  Physical Exam:  General: alert, cooperative, and no distress Lochia: appropriate Uterine Fundus: firm Incision: n/a DVT Evaluation: No evidence of DVT seen on physical exam. Negative Homan's sign. No cords or calf tenderness. No significant calf/ankle edema.  Recent Labs    08/30/21 1713  HGB 10.3*  HCT 30.6*    Assessment/Plan: Breastfeeding, Lactation consult, Circumcision prior to discharge, and Contraception Depo BP stable PPD #1 s/p SVD Discharge tomorrow   LOS: 1 day   14/04/22, CNM 08/31/2021, 7:34 AM

## 2021-09-01 ENCOUNTER — Other Ambulatory Visit (HOSPITAL_COMMUNITY): Payer: Self-pay

## 2021-09-01 DIAGNOSIS — O165 Unspecified maternal hypertension, complicating the puerperium: Secondary | ICD-10-CM | POA: Diagnosis not present

## 2021-09-01 LAB — TYPE AND SCREEN
ABO/RH(D): B POS
Antibody Screen: POSITIVE
Donor AG Type: NEGATIVE
Donor AG Type: NEGATIVE
Unit division: 0
Unit division: 0

## 2021-09-01 LAB — BPAM RBC
Blood Product Expiration Date: 202301022359
Blood Product Expiration Date: 202301022359
Unit Type and Rh: 7300
Unit Type and Rh: 7300

## 2021-09-01 MED ORDER — NIFEDIPINE ER 30 MG PO TB24
30.0000 mg | ORAL_TABLET | Freq: Every day | ORAL | 2 refills | Status: AC
  Filled 2021-09-01: qty 30, 30d supply, fill #0

## 2021-09-01 MED ORDER — IBUPROFEN 600 MG PO TABS
600.0000 mg | ORAL_TABLET | Freq: Four times a day (QID) | ORAL | 0 refills | Status: AC | PRN
  Filled 2021-09-01: qty 30, 8d supply, fill #0

## 2021-09-01 MED ORDER — NIFEDIPINE ER OSMOTIC RELEASE 30 MG PO TB24
30.0000 mg | ORAL_TABLET | Freq: Every day | ORAL | Status: DC
  Administered 2021-09-01: 30 mg via ORAL
  Filled 2021-09-01: qty 1

## 2021-09-01 NOTE — Lactation Note (Signed)
This note was copied from a baby's chart. Lactation Consultation Note  Patient Name: Kathryn Livingston [id number] Date: 09/01/2021 Reason for consult: Follow-up assessment;Infant < 6lbs Age:37 hours  Mother has primarily been formula feeding. Recently pumped with no drops expressed.  Provided education.  Encouraged pumping q 3 hours.  [facility] loaner pump referral faxed. Suggest call if latch assistance is needed or desired.   Feeding Mother's Current Feeding Choice: Breast Milk and Formula Nipple Type: Nfant Standard Flow (white) Interventions Interventions: Education;DEBP  Discharge Pump: [facility] Loaner WIC Program: Yes  Consult Status Consult Status: Follow-up Date: 09/02/21 Follow-up type: In-patient    Dahlia Byes [facility] 09/01/2021, 12:55 PM

## 2021-09-02 LAB — SURGICAL PATHOLOGY

## 2021-09-04 ENCOUNTER — Encounter: Payer: Self-pay | Admitting: *Deleted

## 2021-09-04 NOTE — BH Specialist Note (Signed)
Pt did not arrive to video visit; did answer the phone, and said she would join video; after waiting, pt did not join video, and did not answer second call; Left HIPPA-compliant message to call back Asher Muir from [employer] for [employer] at [facility] at  [phone] [facility] office).  left MyChart message for patient.

## 2021-09-07 ENCOUNTER — Telehealth: Payer: Self-pay

## 2021-09-07 ENCOUNTER — Ambulatory Visit: Payer: Self-pay

## 2021-09-07 NOTE — Telephone Encounter (Signed)
Called pt to follow up on missed appt for BP check. VM left and MyChart message sent for follow up.

## 2021-09-14 ENCOUNTER — Telehealth (HOSPITAL_COMMUNITY): Payer: Self-pay | Admitting: *Deleted

## 2021-09-14 NOTE — Telephone Encounter (Signed)
Line disconnected while talking.  Duffy Rhody, RN 09-14-2021 at 432pm

## 2021-09-15 ENCOUNTER — Ambulatory Visit: Payer: Self-pay

## 2021-09-17 ENCOUNTER — Ambulatory Visit: Payer: Medicaid Other | Admitting: Clinical

## 2021-09-17 DIAGNOSIS — Z91199 Patient's noncompliance with other medical treatment and regimen due to unspecified reason: Secondary | ICD-10-CM

## 2021-10-12 ENCOUNTER — Ambulatory Visit: Payer: Self-pay | Admitting: Obstetrics and Gynecology

## 2021-10-12 ENCOUNTER — Encounter: Payer: Self-pay | Admitting: Obstetrics and Gynecology

## 2021-10-14 NOTE — Progress Notes (Signed)
Patient did not keep her postpartum appointment for 10/12/2021.  Cornelia Copa MD Attending Center for [employer] Midwife)

## 2021-10-15 ENCOUNTER — Ambulatory Visit: Payer: Self-pay

## 2021-11-23 ENCOUNTER — Encounter: Payer: Self-pay | Admitting: Obstetrics and Gynecology

## 2023-04-12 IMAGING — US US OB COMP LESS 14 WK
1 series · 15 of 28 positions shown · non-contrast
Comparison: Ultrasound September 04, 2018

CLINICAL DATA: Positive beta HCG (92,356) with cramping

EXAM:
OBSTETRIC <14 WK ULTRASOUND
TECHNIQUE: Transabdominal ultrasound was performed for evaluation of the
gestation as well as the maternal uterus and adnexal regions.

[Series 1: us ob comp less 14 wk · 15 of 39 slices shown]
[im 1/39]
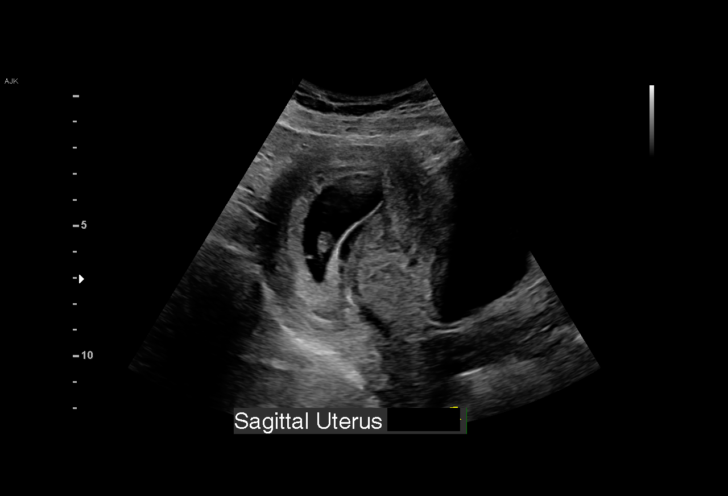
[im 3/39]
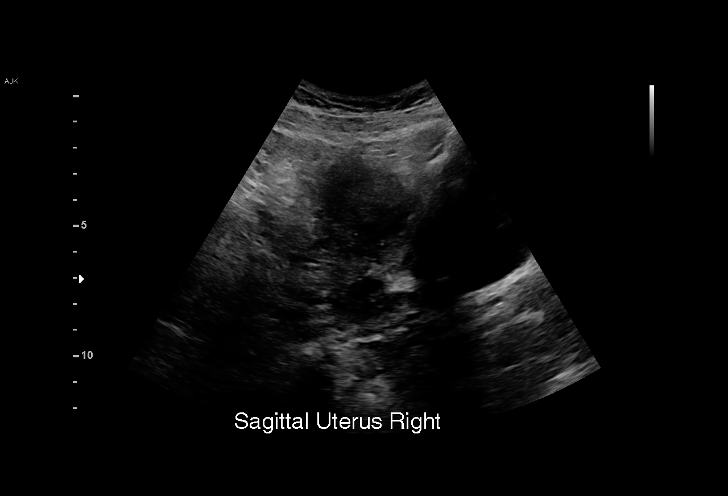
[im 6/39]
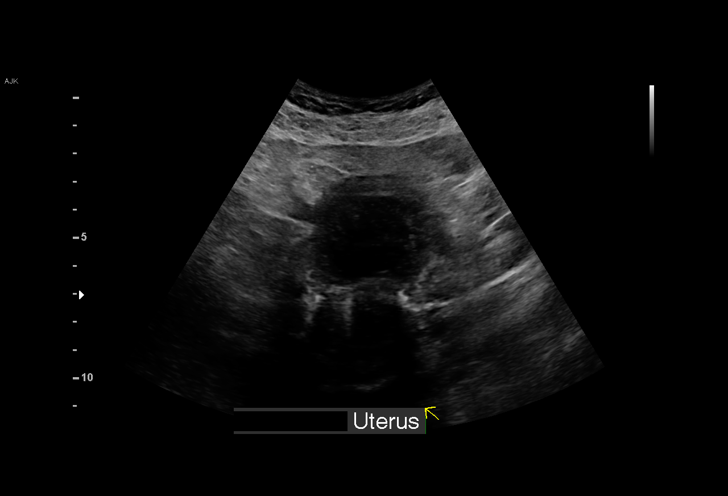
[im 9/39]
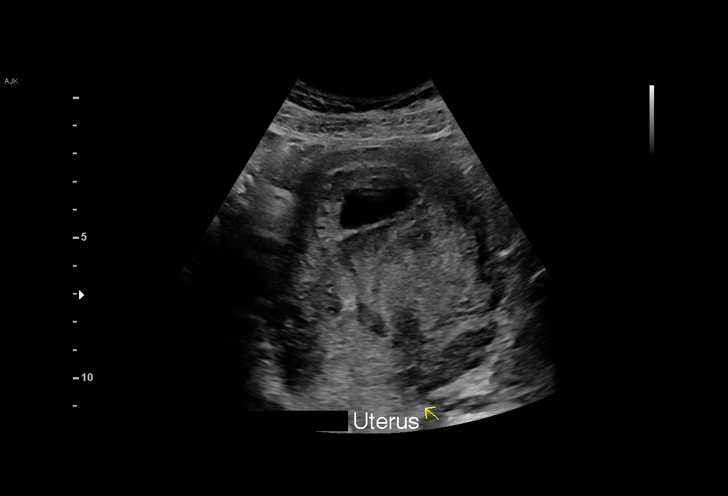
[im 12/39]
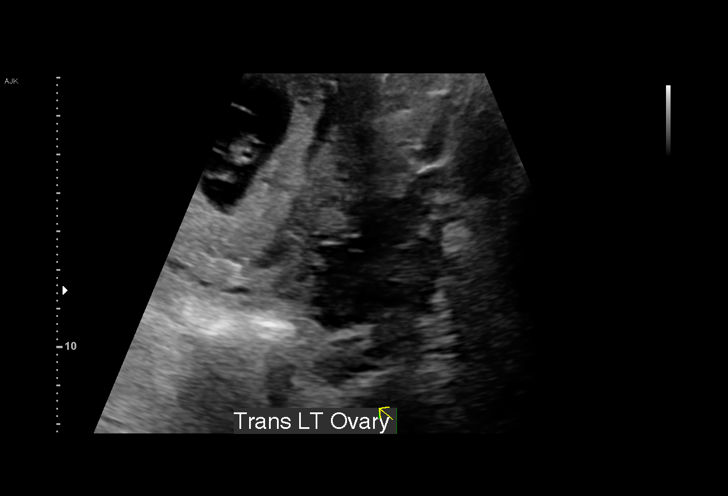
[im 15/39]
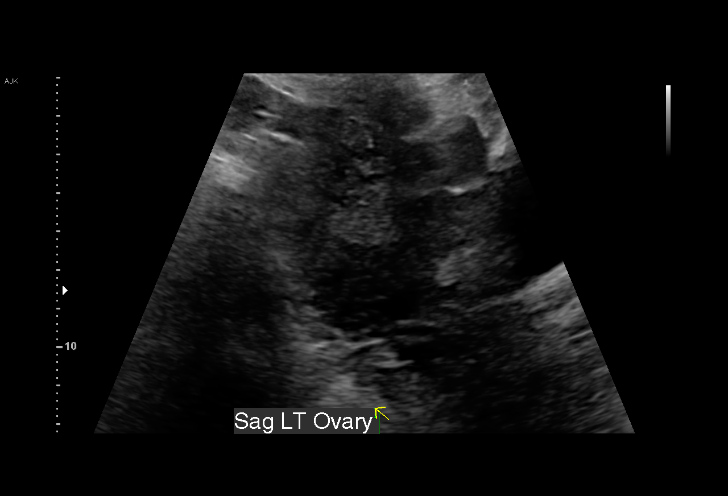
[im 17/39]
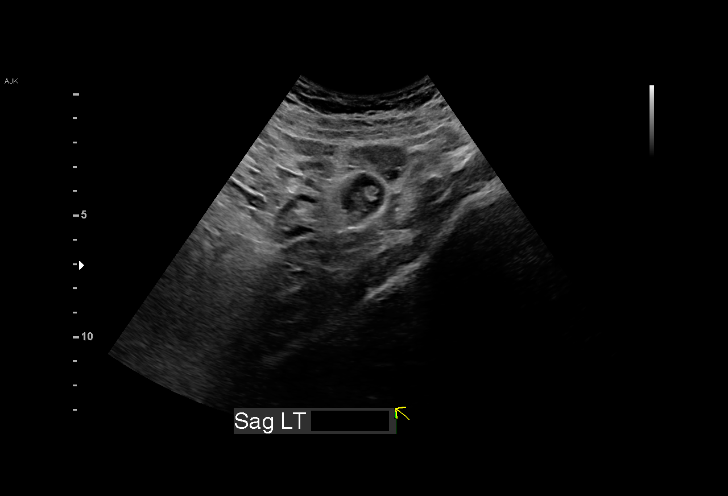
[im 20/39]
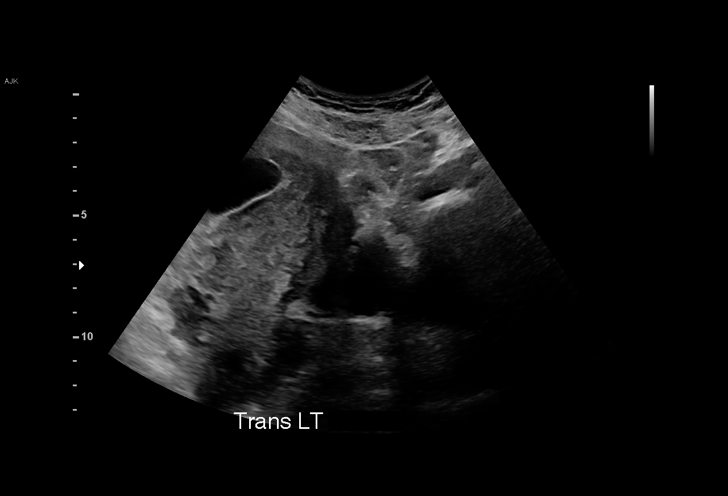
[im 22/39]
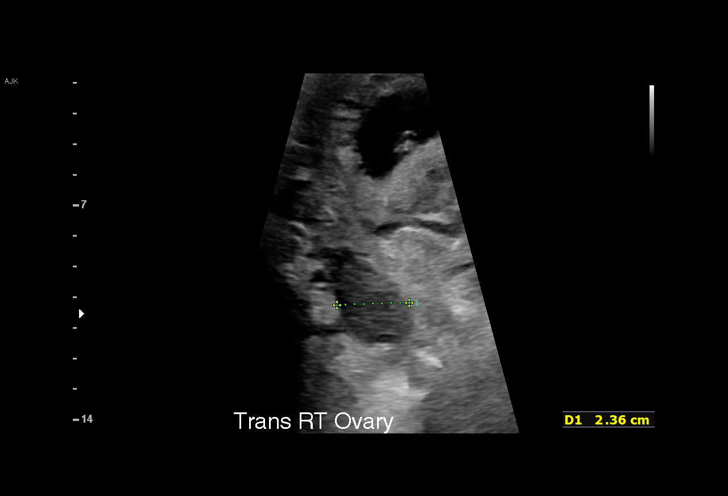
[im 24/39]
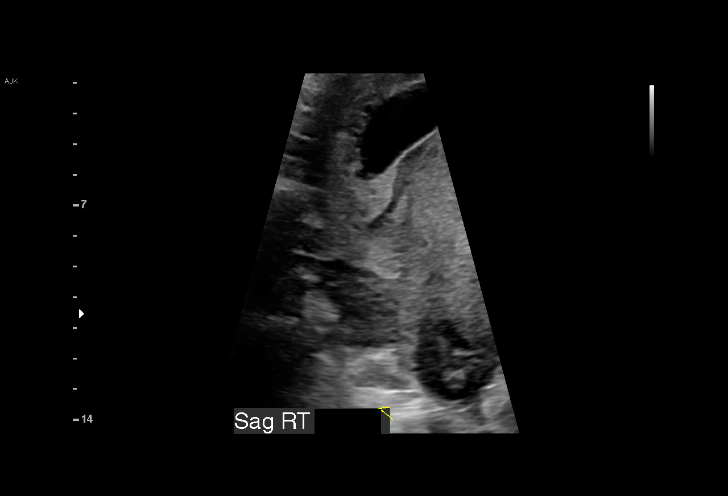
[im 27/39]
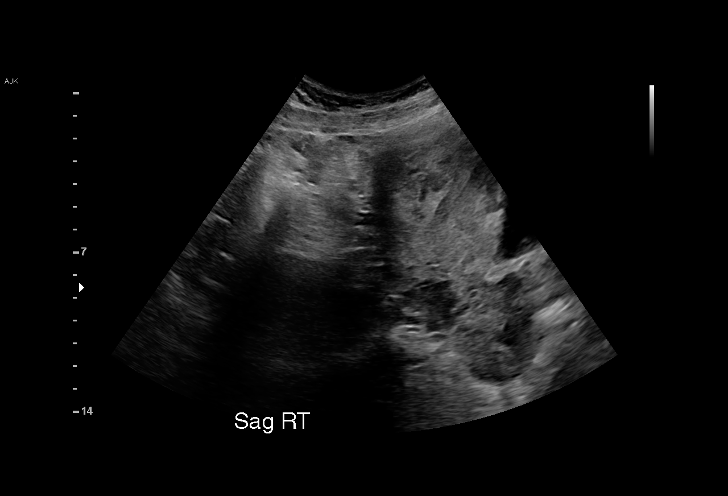
[im 30/39]
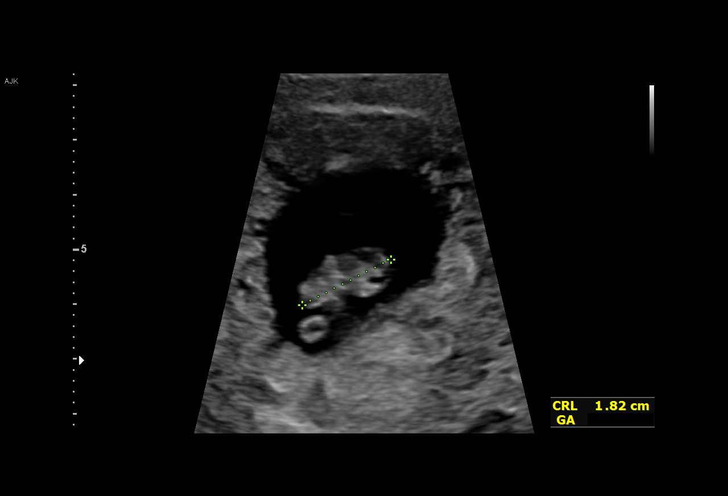
[im 33/39]
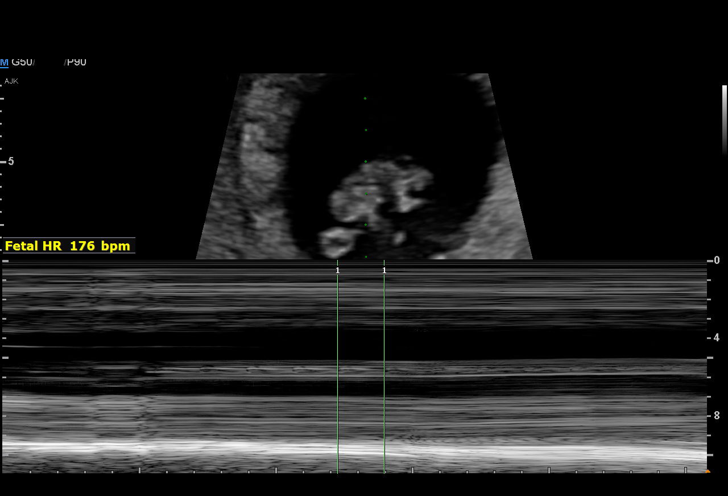
[im 36/39]
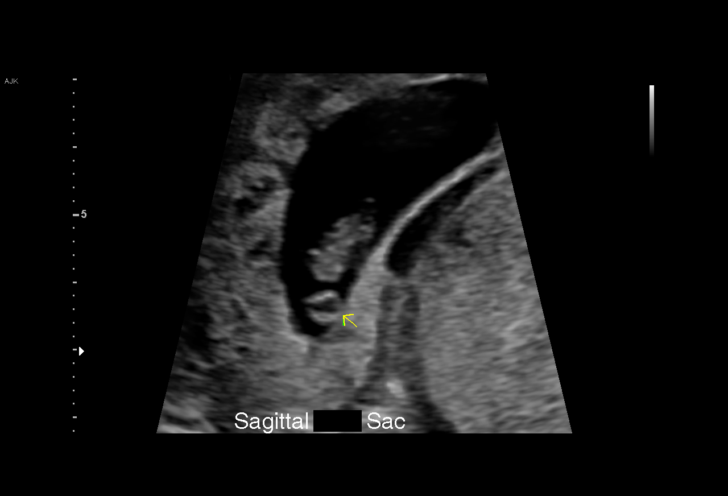
[im 39/39]
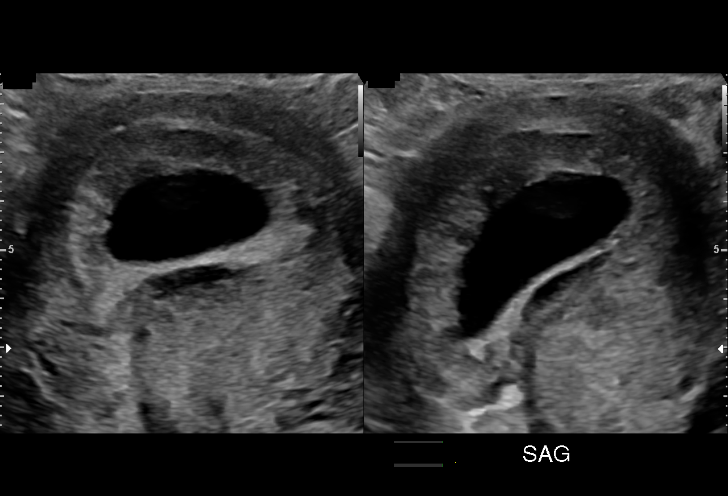

[15 of 28 positions shown; findings below may reference images not displayed]

FINDINGS: Intrauterine gestational sac: Single

Yolk sac:  Visualized.

Embryo:  Visualized.

Cardiac Activity: Visualized.

Heart Rate: 176 bpm

CRL: 18 mm   8 w 1 d                  US EDC: September 08, 2021

Subchorionic hemorrhage:  Small volume

Maternal uterus/adnexae: Unremarkable
IMPRESSION: Single viable early intrauterine gestation with small volume
subchorionic hemorrhage, further described above.

## 2023-07-20 IMAGING — US US MFM OB DETAIL+14 WK
1 series · 12 of 28 positions shown · non-contrast
Comparison: none

[Series 1: us mfm ob detail+14 wk · 138 acquisitions, 12 frames shown]
[im 6/138]
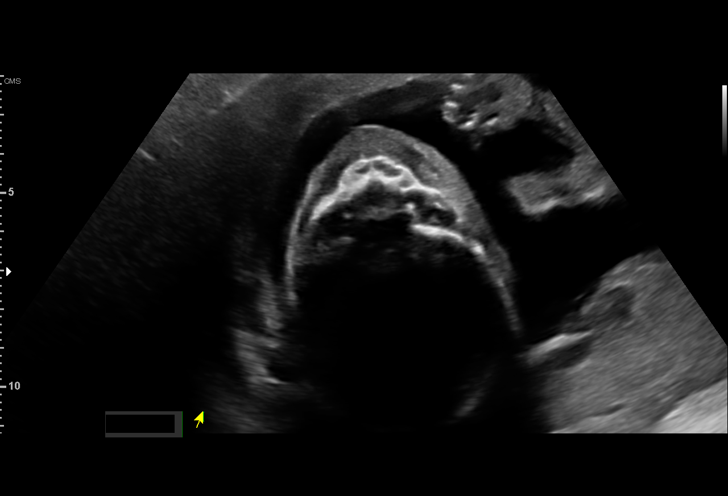
[im 16/138]
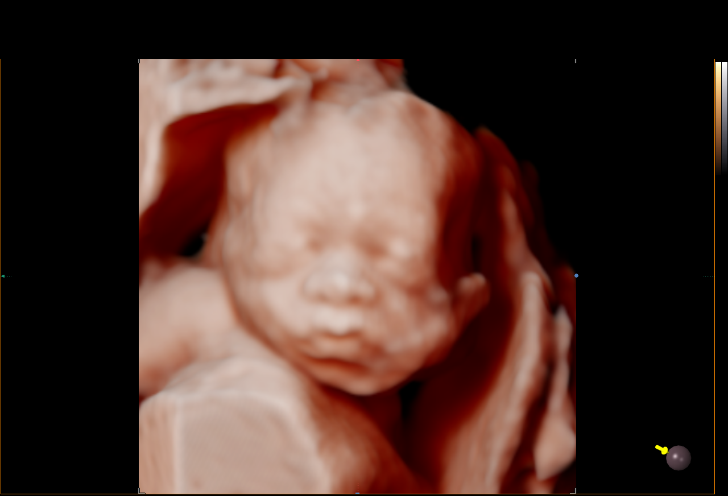
[im 26/138]
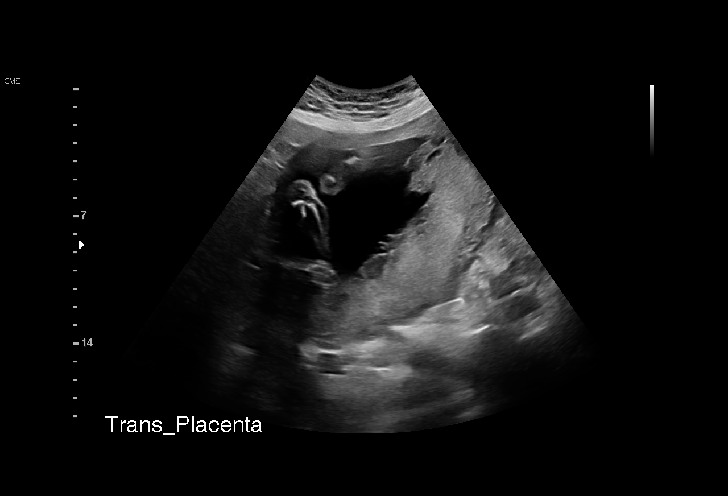
[im 41/138]
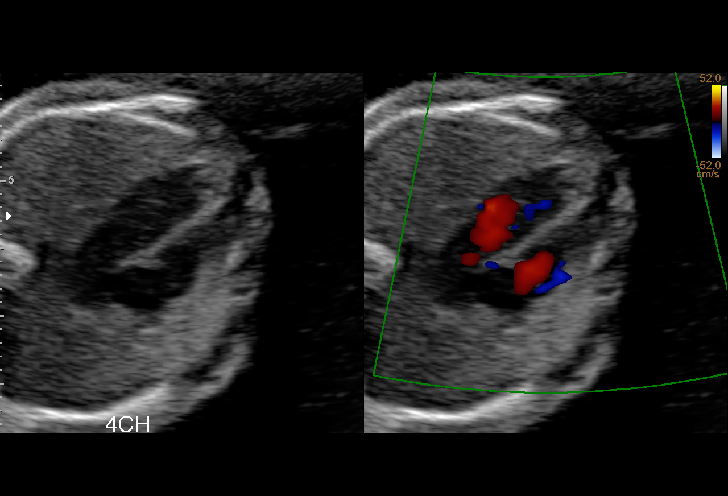
[im 51/138]
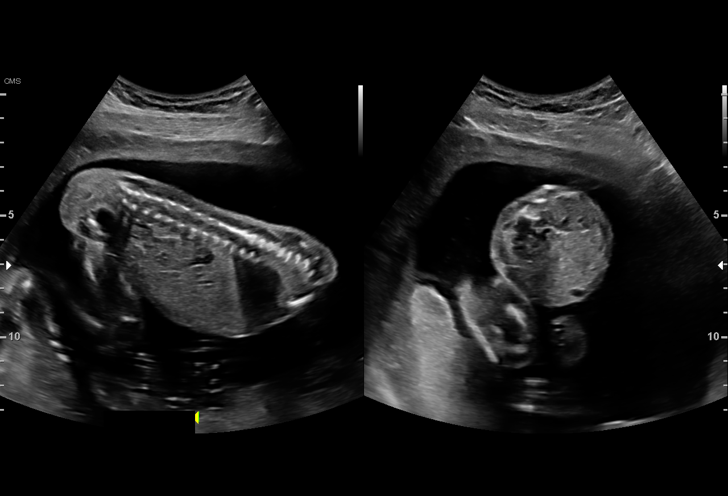
[im 61/138]
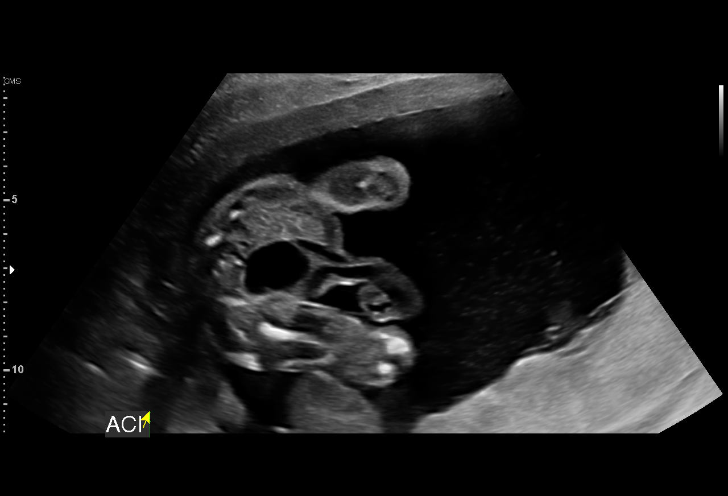
[im 77/138]
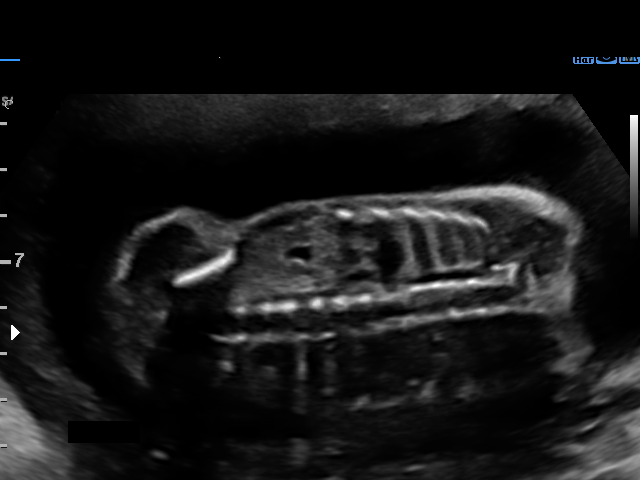
[im 87/138]
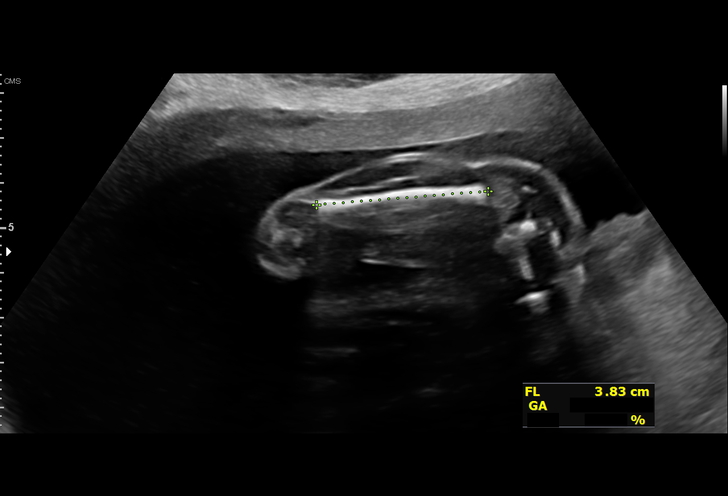
[im 97/138]
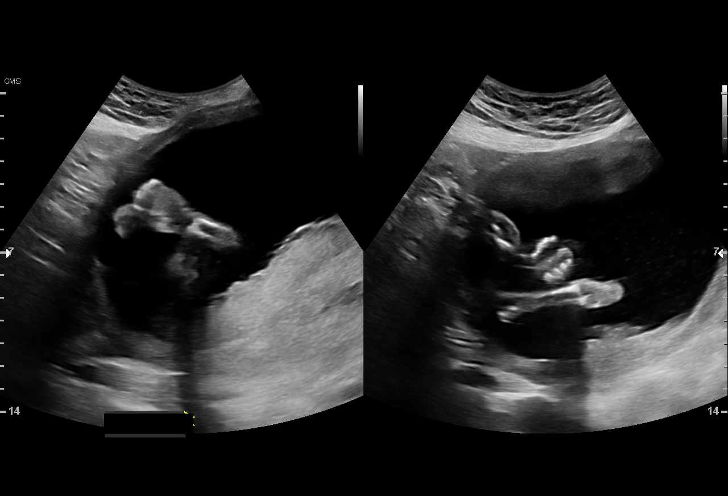
[im 112/138]
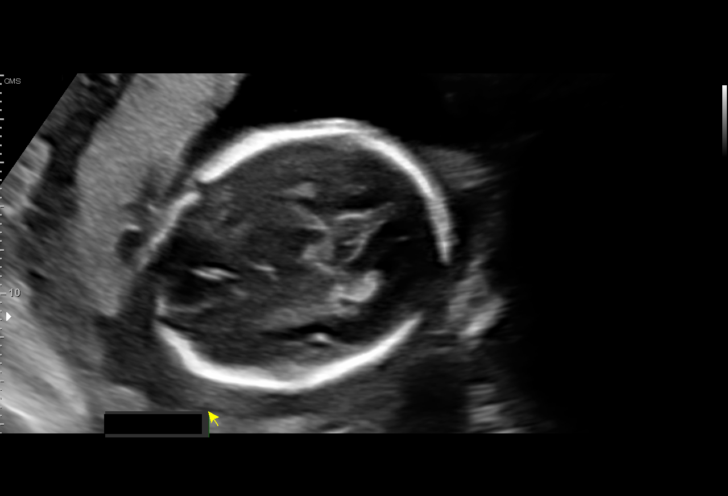
[im 122/138]
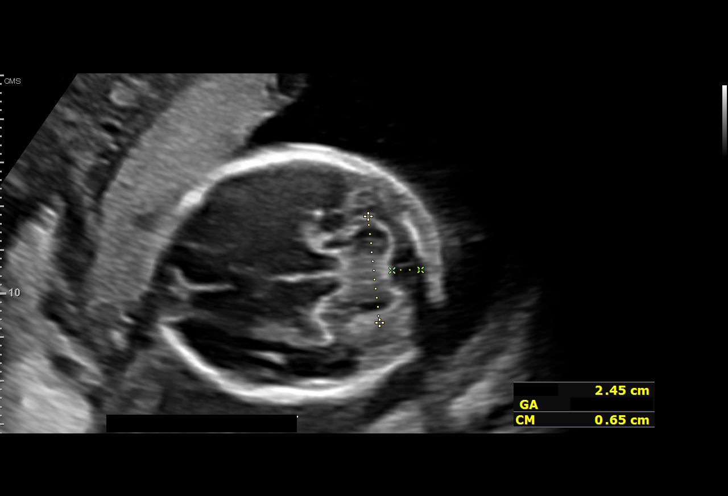
[im 132/138]
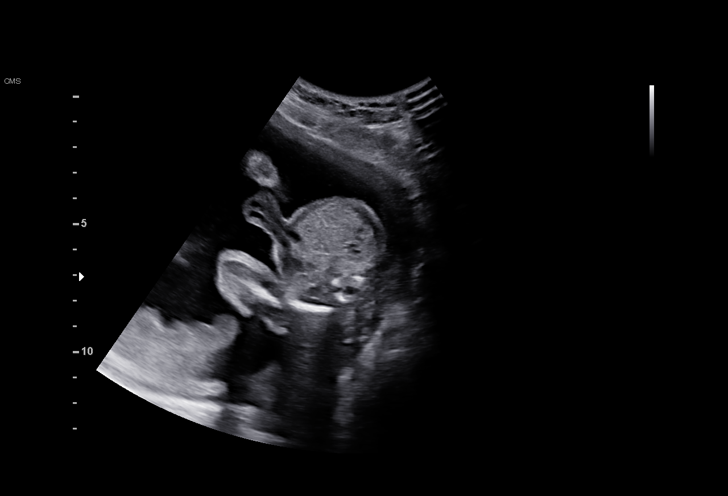

[12 of 28 positions shown; findings below may reference images not displayed]

Indications

 Advanced maternal age multigravida 34,
 second trimester
 Obesity complicating pregnancy, second
 trimester (pregravid BMI 32)
 Poor obstetric history: Previous fetal growth
 restriction (FGR)
 22 weeks gestation of pregnancy
 Antenatal screening for malformations
 Genetic carrier (sickle cell, silent Breka Milani)
 Low risk NIPS, neg AFP
Fetal Evaluation

 Num Of Fetuses:         1
 Fetal Heart Rate(bpm):  153
 Cardiac Activity:       Observed
 Presentation:           Cephalic
 Placenta:               Posterior
 P. Cord Insertion:      Visualized

 Amniotic Fluid
 AFI FV:      Within normal limits

                             Largest Pocket(cm)

Biometry
 BPD:      54.6  mm     G. Age:  22w 4d         37  %    CI:        69.93   %    70 - 86
                                                         FL/HC:      18.4   %    19.2 -
 HC:      208.3  mm     G. Age:  22w 6d         38  %    HC/AC:      1.20        1.05 -
 AC:      173.3  mm     G. Age:  22w 2d         24  %    FL/BPD:     70.3   %    71 - 87
 FL:       38.4  mm     G. Age:  22w 2d         22  %    FL/AC:      22.2   %    20 - 24
 HUM:      35.5  mm     G. Age:  22w 2d         31  %
 CER:      24.3  mm     G. Age:  22w 2d         60  %

 LV:        6.5  mm
 CM:        6.5  mm

 Est. FW:     497  gm      1 lb 2 oz     21  %
OB History

 Gravidity:    6         Term:   4        Prem:   0        SAB:   1
 TOP:          0       Ectopic:  0        Living: 4
Gestational Age

 LMP:           22w 6d        Date:  11/28/20                 EDD:   09/04/21
 U/S Today:     22w 4d                                        EDD:   09/06/21
 Best:          22w 6d     Det. By:  LMP  (11/28/20)          EDD:   09/04/21
Anatomy

 Cranium:               Appears normal         LVOT:                   Appears normal
 Cavum:                 Appears normal         Aortic Arch:            Appears normal
 Ventricles:            Appears normal         Ductal Arch:            Appears normal
 Choroid Plexus:        Appears normal         Diaphragm:              Appears normal
 Cerebellum:            Appears normal         Stomach:                Appears normal, left
                                                                       sided
 Posterior Fossa:       Appears normal         Abdomen:                Appears normal
 Nuchal Fold:           Not applicable (>20    Abdominal Wall:         Appears nml (cord
                        wks GA)                                        insert, abd wall)
 Face:                  Appears normal         Cord Vessels:           Appears normal (3
                        (orbits and profile)                           vessel cord)
 Lips:                  Appears normal         Kidneys:                Appear normal
 Palate:                Appears normal         Bladder:                Appears normal
 Thoracic:              Appears normal         Spine:                  Appears normal
 Heart:                 Appears normal         Upper Extremities:      Appears normal
                        (4CH, axis, and
                        situs)
 RVOT:                  Appears normal         Lower Extremities:      Appears normal
Targeted Anatomy

 Central Nervous System
 Cereb./Vermis:         Appears normal

 Head/Neck
 Nasal Bone:            Present                Mandible:               Appears normal
 Orbits/Eyes:           Nml w/ lenses          Maxilla:                Appears normal

 Thorax
 SVC:                   Appears normal         3 V Trachea View:       Appears normal
 3 Vessel View:         Appears normal         IVC:                    Appears normal
 Extremities
 Lt Humerus:            Appears normal         Lt Femur:               Appears normal
 Rt Humerus:            Appears normal         Rt Femur:               Appears normal
 Lt Forearm:            Appears normal         Lt Lower Leg:           Appears normal
 Rt Forearm:            Appears normal         Rt Lower Leg:           Appears normal
 Lt Hand:               Open hand nml          Lt Foot:                Nml heel/foot
 Rt Hand:               Open hand nml          Rt Foot:                Nml heel/foot

 Other
 Genitalia:             Normal Male
Cervix Uterus Adnexa

 Cervix
 Length:           4.67  cm.
 Normal appearance by transabdominal scan.

 Uterus
 No abnormality visualized.

 Right Ovary
 Within normal limits.

 Left Ovary
 Within normal limits.

 Cul De Sac
 No free fluid seen.

 Adnexa
 No abnormality visualized.
Comments

 This patient was seen for a detailed fetal anatomy scan due
 to advanced maternal age.  She has a prior pregnancy that
 was complicated by an IUGR infant who weighed 5 pounds 1
 ounces at term.
 She denies any significant past medical history and denies
 any problems in her current pregnancy.
 She had a cell free DNA test earlier in her pregnancy which
 indicated a low risk for trisomy 21, 18, and 13. A male fetus is
 predicted.
 She was informed that the fetal growth and amniotic fluid
 level were appropriate for her gestational age.
 There were no obvious fetal anomalies noted on today's
 ultrasound exam.
 The patient was informed that anomalies may be missed due
 to technical limitations. If the fetus is in a suboptimal position
 or maternal habitus is increased, visualization of the fetus in
 the maternal uterus may be impaired.
 The increased risk of fetal aneuploidy due to advanced
 maternal age was discussed. Due to advanced maternal age,
 the patient was offered and declined an amniocentesis today
 for definitive diagnosis of fetal aneuploidy.
 Due to her history of a prior IUGR fetus, we will continue to
 follow her with growth ultrasounds.
 A follow-up growth scan was scheduled in 5 weeks.
